# Patient Record
Sex: Female | Born: 1955 | Race: Black or African American | Hispanic: No | Smoking: Current every day smoker
Health system: Southern US, Community
[De-identification: ages and names within clinical notes are randomized; demographics above are authoritative.]

## PROBLEM LIST (undated history)

## (undated) DIAGNOSIS — E119 Type 2 diabetes mellitus without complications: Secondary | ICD-10-CM

## (undated) DIAGNOSIS — I219 Acute myocardial infarction, unspecified: Secondary | ICD-10-CM

## (undated) DIAGNOSIS — I10 Essential (primary) hypertension: Secondary | ICD-10-CM

---

## 2004-05-27 ENCOUNTER — Emergency Department: Payer: Self-pay | Admitting: Emergency Medicine

## 2004-12-16 ENCOUNTER — Emergency Department: Payer: Self-pay | Admitting: Unknown Physician Specialty

## 2005-02-25 ENCOUNTER — Emergency Department: Payer: Self-pay | Admitting: Emergency Medicine

## 2005-03-09 ENCOUNTER — Emergency Department: Payer: Self-pay | Admitting: Emergency Medicine

## 2005-04-11 ENCOUNTER — Emergency Department: Payer: Self-pay | Admitting: Unknown Physician Specialty

## 2005-08-05 ENCOUNTER — Emergency Department: Payer: Self-pay | Admitting: Emergency Medicine

## 2010-10-30 ENCOUNTER — Emergency Department: Payer: Self-pay | Admitting: Unknown Physician Specialty

## 2013-08-19 ENCOUNTER — Emergency Department: Payer: Self-pay | Admitting: Emergency Medicine

## 2015-12-12 ENCOUNTER — Encounter: Payer: Self-pay | Admitting: *Deleted

## 2015-12-12 ENCOUNTER — Emergency Department
Admission: EM | Admit: 2015-12-12 | Discharge: 2015-12-12 | Disposition: A | Discharge status: Home / Self Care | Payer: Medicare HMO | Attending: Emergency Medicine | Admitting: Emergency Medicine

## 2015-12-12 DIAGNOSIS — I252 Old myocardial infarction: Secondary | ICD-10-CM | POA: Diagnosis not present

## 2015-12-12 DIAGNOSIS — M5416 Radiculopathy, lumbar region: Secondary | ICD-10-CM | POA: Insufficient documentation

## 2015-12-12 DIAGNOSIS — F17200 Nicotine dependence, unspecified, uncomplicated: Secondary | ICD-10-CM | POA: Diagnosis not present

## 2015-12-12 DIAGNOSIS — I10 Essential (primary) hypertension: Secondary | ICD-10-CM | POA: Diagnosis not present

## 2015-12-12 DIAGNOSIS — M79604 Pain in right leg: Secondary | ICD-10-CM | POA: Diagnosis present

## 2015-12-12 HISTORY — DX: Essential (primary) hypertension: I10

## 2015-12-12 HISTORY — DX: Acute myocardial infarction, unspecified: I21.9

## 2015-12-12 MED ORDER — TRAMADOL HCL 50 MG PO TABS: 50.0000 mg | ORAL_TABLET | Freq: Two times a day (BID) | ORAL | Status: DC

## 2015-12-12 MED ORDER — CYCLOBENZAPRINE HCL 5 MG PO TABS: 5.0000 mg | ORAL_TABLET | Freq: Three times a day (TID) | ORAL | Status: DC | PRN

## 2015-12-12 NOTE — ED Notes (Signed)
Pt arrived to ED reporting right hip and leg pain beginning 4 days ago. Pt denies trauma or injury to leg. Pt reports pain began suddenly and the pain has been constant since. Pt denies anything decreasing the pain and movement increases the pain. Pt able to walk into triage room. No acute distress noted.

## 2015-12-12 NOTE — Discharge Instructions (Signed)
Lumbosacral Radiculopathy Lumbosacral radiculopathy is a condition that involves the spinal nerves and nerve roots in the low back and bottom of the spine. The condition develops when these nerves and nerve roots move out of place or become inflamed and cause symptoms. CAUSES This condition may be caused by:  Pressure from a disk that bulges out of place (herniated disk). A disk is a plate of cartilage that separates bones in the spine.  Disk degeneration.  A narrowing of the bones of the lower back (spinal stenosis).  A tumor.  An infection.  An injury that places sudden pressure on the disks that cushion the bones of your lower spine. RISK FACTORS This condition is more likely to develop in:  Males aged 30-50 years.  Females aged 71-60 years.  People who lift improperly.  People who are overweight or live a sedentary lifestyle.  People who smoke.  People who perform repetitive activities that strain the spine. SYMPTOMS Symptoms of this condition include:  Pain that goes down from the back into the legs (sciatica). This is the most common symptom. The pain may be worse with sitting, coughing, or sneezing.  Pain and numbness in the arms and legs.  Muscle weakness.  Tingling.  Loss of bladder control or bowel control. DIAGNOSIS This condition is diagnosed with a physical exam and medical history. If the pain is lasting, you may have tests, such as:  MRI scan.  X-ray.  CT scan.  Myelogram.  Nerve conduction study. TREATMENT This condition is often treated with:  Hot packs and ice applied to affected areas.  Stretches to improve flexibility.  Exercises to strengthen back muscles.  Physical therapy.  Pain medicine.  A steroid injection in the spine. In some cases, no treatment is needed. If the condition is long-lasting (chronic), or if symptoms are severe, treatment may involve surgery or lifestyle changes, such as following a weight loss plan. HOME  CARE INSTRUCTIONS Medicines  Take medicines only as directed by your health care provider.  Do not drive or operate heavy machinery while taking pain medicine. Injury Care  Apply a heat pack to the injured area as directed by your health care provider.  Apply ice to the affected area:  Put ice in a plastic bag.  Place a towel between your skin and the bag.  Leave the ice on for 20-30 minutes, every 2 hours while you are awake or as needed. Or, leave the ice on for as long as directed by your health care provider. Other Instructions  If you were shown how to do any exercises or stretches, do them as directed by your health care provider.  If your health care provider prescribed a diet or exercise program, follow it as directed.  Keep all follow-up visits as directed by your health care provider. This is important. SEEK MEDICAL CARE IF:  Your pain does not improve over time even when taking pain medicines. SEEK IMMEDIATE MEDICAL CARE IF:  Your develop severe pain.  Your pain suddenly gets worse.  You develop increasing weakness in your legs.  You lose the ability to control your bladder or bowel.  You have difficulty walking or balancing.  You have a fever.   This information is not intended to replace advice given to you by your health care provider. Make sure you discuss any questions you have with your health care provider.   Document Released: 08/01/2005 Document Revised: 12/16/2014 Document Reviewed: 07/28/2014 Elsevier Interactive Patient Education [employer].  Your symptoms appear to be consistent with a lumbar nerve irritation causing pain down your right leg. You should follow-up with your provider as planned next week. Take the prescription anti-spasm medicine, along with the pain medicine as needed. Return to the ED as needed.

## 2015-12-12 NOTE — ED Provider Notes (Signed)
[facility] Emergency Department Provider Note ____________________________________________  Time seen: 1313  I have reviewed the triage vital signs and the nursing notes.  HISTORY  Chief Complaint  Leg Pain   HPI Lindsay Lopez is a 60 y.o. female since the ED for evaluation of the pain to the right posterior buttocks that refers around to the anterior thigh just past the knee. She describes onset of the discomfort about 4 days prior to arrival. She denies any history of trauma, fall, or accident. She describes the pain as sharp in nature "like somebody cutting." She has a history of diabetic neuropathy for which she takes gabapentin. She has previously been evaluated for low back pain with her most recent x-rays reported about a year ago [facility]. She denies any particular diagnosis or treatment regimen following x-rays. She is however scheduled to see her primary care provider on Thursday of next week. She reports however that the pain had increased to the point where she could not wait until Thursday. She is only taking Tylenol in addition to her gabapentin and other home medications for diabetes and hypertension. She denies any hematuria, dysuria, flank pain, bladder or bowel incontinence, or leg weakness.Results for her discomfort at a 10/10 in triage.  Past Medical History  Diagnosis Date  . Hypertension   . MI (myocardial infarction) (HCC)     2010    There are no active problems to display for this patient.   No past surgical history on file.  Current Outpatient Rx  Name  Route  Sig  Dispense  Refill  . cyclobenzaprine (FLEXERIL) 5 MG tablet   Oral   Take 1 tablet (5 mg total) by mouth every 8 (eight) hours as needed for muscle spasms.   12 tablet   0   . traMADol (ULTRAM) 50 MG tablet   Oral   Take 1 tablet (50 mg total) by mouth 2 (two) times daily.   10 tablet   0     Allergies Review of patient's allergies indicates no  known allergies.  No family history on file.  Social History Social History  Substance Use Topics  . Smoking status: Current Every Day Smoker -- 0.50 packs/day  . Smokeless tobacco: Not on file  . Alcohol Use: No   Review of Systems  Constitutional: Negative for fever. Gastrointestinal: Negative for abdominal pain, vomiting and diarrhea. Genitourinary: Negative for dysuria. Musculoskeletal: Positive for back pain. Skin: Negative for rash. Neurological: Negative for headaches, focal weakness or numbness. ____________________________________________  PHYSICAL EXAM:  VITAL SIGNS: ED Triage Vitals  Enc Vitals Group     BP 12/12/15 1151 142/100 mmHg     Pulse Rate 12/12/15 1151 60     Resp 12/12/15 1151 16     Temp 12/12/15 1151 97.8 F (36.6 C)     Temp Source 12/12/15 1151 Oral     SpO2 12/12/15 1151 98 %     Weight 12/12/15 1151 202 lb (91.627 kg)     Height 12/12/15 1151 5\' 8"  (1.727 m)     Head Cir --      Peak Flow --      Pain Score 12/12/15 1152 10     Pain Loc --      Pain Edu? --      Excl. in GC? --    Constitutional: Alert and oriented. Well appearing and in no distress. Head: Normocephalic and atraumatic. Cardiovascular: Normal rate, regular rhythm.  Respiratory: Normal respiratory  effort. No wheezes/rales/rhonchi. Musculoskeletal: No spinal on it without midline tenderness, spasm, deformity, or step-off. Patient with fluid transition from sit to stand without difficulty. She is able to demonstrate a single leg straight leg raise without Trendelenburg. She is also noted to have full lumbar flexion range and extension range is somewhat self-limited. She is normal toe raise. On exam. Nontender with normal range of motion in all extremities.  Neurologic: Cranial nerves II through XII grossly intact. Normal LE DTRs bilaterally. Normal toe dorsiflexion on exam. Normal gait without ataxia. Normal speech and language. No gross focal neurologic deficits are  appreciated. Skin:  Skin is warm, dry and intact. No rash noted. ____________________________________________   RADIOLOGY Deferred. ____________________________________________  INITIAL IMPRESSION / ASSESSMENT AND PLAN / ED COURSE  She presents to the ED with a history of chronic low back pain with what appears to be a left radiculopathy. The symptoms appear to be distributed in the L3-4 nerve root. Patient will be referred to her primary care provider as scheduled on Thursday for more definitive evaluation management. Since there is no acute findings she will be discharged with a prescription for Flexeril and alternatives as directed. She is advised to return to the ED for acutely worsening symptoms including bladder or bowel incontinence or leg weakness. ____________________________________________  FINAL CLINICAL IMPRESSION(S) / ED DIAGNOSES  Final diagnoses:  Lumbar radiculopathy  Lumbar back pain with radiculopathy affecting right lower extremity      Lissa Hoard, PA-C 12/12/15 1351  Governor Rooks, MD 12/12/15 [phone]

## 2016-06-26 ENCOUNTER — Encounter: Payer: Self-pay | Admitting: Emergency Medicine

## 2016-06-26 ENCOUNTER — Emergency Department
Admission: EM | Admit: 2016-06-26 | Discharge: 2016-06-26 | Disposition: A | Discharge status: Home / Self Care | Payer: Medicare HMO | Attending: Emergency Medicine | Admitting: Emergency Medicine

## 2016-06-26 DIAGNOSIS — Z79899 Other long term (current) drug therapy: Secondary | ICD-10-CM | POA: Insufficient documentation

## 2016-06-26 DIAGNOSIS — I10 Essential (primary) hypertension: Secondary | ICD-10-CM | POA: Insufficient documentation

## 2016-06-26 DIAGNOSIS — H1032 Unspecified acute conjunctivitis, left eye: Secondary | ICD-10-CM | POA: Diagnosis not present

## 2016-06-26 DIAGNOSIS — F17200 Nicotine dependence, unspecified, uncomplicated: Secondary | ICD-10-CM | POA: Diagnosis not present

## 2016-06-26 DIAGNOSIS — H578 Other specified disorders of eye and adnexa: Secondary | ICD-10-CM | POA: Diagnosis present

## 2016-06-26 MED ORDER — POLYMYXIN B-TRIMETHOPRIM 10000-0.1 UNIT/ML-% OP SOLN: 2.0000 [drp] | Freq: Four times a day (QID) | OPHTHALMIC | 0 refills | Status: DC

## 2016-06-26 NOTE — ED Provider Notes (Signed)
[facility] Emergency Department Provider Note  ____________________________________________  Time seen: Approximately 5:03 PM  I have reviewed the triage vital signs and the nursing notes.   HISTORY  Chief Complaint Eye Problem    HPI Lindsay Lopez is a 60 y.o. female who presents emergency department complaining of left eye irritation, redness, purulent discharge. Patient states symptoms have been ongoing 2 days. Initially, patient thought symptoms were related to her allergies but she takes chronic allergy medication with no improvement. Patient states that today her eye began draining purulent green matter. She denies any visual changes. She denies any ocular pain. She denies any trauma to the eye. She does not wear glasses or contacts. No complaint of this time.   Past Medical History:  Diagnosis Date  . Hypertension   . MI (myocardial infarction)    2010    There are no active problems to display for this patient.   History reviewed. No pertinent surgical history.  Prior to Admission medications   Medication Sig Start Date End Date Taking? Authorizing Provider  cyclobenzaprine (FLEXERIL) 5 MG tablet Take 1 tablet (5 mg total) by mouth every 8 (eight) hours as needed for muscle spasms. 12/12/15   Jenise V Bacon Menshew, PA-C  traMADol (ULTRAM) 50 MG tablet Take 1 tablet (50 mg total) by mouth 2 (two) times daily. 12/12/15   Jenise V Bacon Menshew, PA-C  trimethoprim-polymyxin b (POLYTRIM) ophthalmic solution Place 2 drops into the left eye every 6 (six) hours. 06/26/16   Delorise RoyalsJonathan D Emberlie Gotcher, PA-C    Allergies Patient has no known allergies.  No family history on file.  Social History Social History  Substance Use Topics  . Smoking status: Current Every Day Smoker    Packs/day: 0.50  . Smokeless tobacco: Never Used  . Alcohol use No     Review of Systems  Constitutional: No fever/chills Eyes: No visual changes. Positive for purulent  discharge from left eye. Positive for left eye redness and irritation. ENT: No upper respiratory complaints. Cardiovascular: no chest pain. Respiratory: no cough. No SOB. Gastrointestinal: No abdominal pain.  No nausea, no vomiting.   Musculoskeletal: Negative for musculoskeletal pain. Skin: Negative for rash, abrasions, lacerations, ecchymosis. Neurological: Negative for headaches, focal weakness or numbness. 10-point ROS otherwise negative.  ____________________________________________   PHYSICAL EXAM:  VITAL SIGNS: ED Triage Vitals  Enc Vitals Group     BP 06/26/16 1654 (!) 168/62     Pulse Rate 06/26/16 1654 68     Resp 06/26/16 1654 18     Temp 06/26/16 1654 97.7 F (36.5 C)     Temp Source 06/26/16 1654 Oral     SpO2 06/26/16 1654 95 %     Weight 06/26/16 1653 210 lb (95.3 kg)     Height 06/26/16 1653 5\' 8"  (1.727 m)     Head Circumference --      Peak Flow --      Pain Score 06/26/16 1653 0     Pain Loc --      Pain Edu? --      Excl. in GC? --      Constitutional: Alert and oriented. Well appearing and in no acute distress. Eyes: Conjunctiva on the left is erythematous. Dried purulent drainage noted to lower eyelashes. Funduscopic exam is unremarkable bilaterally.Marland Kitchen. PERRL. EOMI. Head: Atraumatic. ENT:      Ears:       Nose: No congestion/rhinnorhea.      Mouth/Throat: Mucous membranes are moist.  Neck: No stridor.    Cardiovascular: Normal rate, regular rhythm. Normal S1 and S2.  Good peripheral circulation. Respiratory: Normal respiratory effort without tachypnea or retractions. Lungs CTAB. Good air entry to the bases with no decreased or absent breath sounds. Musculoskeletal: Full range of motion to all extremities. No gross deformities appreciated. Neurologic:  Normal speech and language. No gross focal neurologic deficits are appreciated.  Skin:  Skin is warm, dry and intact. No rash noted. Psychiatric: Mood and affect are normal. Speech and behavior are  normal. Patient exhibits appropriate insight and judgement.   ____________________________________________   LABS (all labs ordered are listed, but only abnormal results are displayed)  Labs Reviewed - No data to display ____________________________________________  EKG   ____________________________________________  RADIOLOGY   No results found.  ____________________________________________    PROCEDURES  Procedure(s) performed:    Procedures    Medications - No data to display   ____________________________________________   INITIAL IMPRESSION / ASSESSMENT AND PLAN / ED COURSE  Pertinent labs & imaging results that were available during my care of the patient were reviewed by me and considered in my medical decision making (see chart for details).  Review of the [place] CSRS was performed in accordance of the [facility] prior to dispensing any controlled drugs.  Clinical Course     Patient's diagnosis is consistent with Bacterial conjunctivitis of the left eye. Patient will be discharged home with prescriptions for antibiotic eyedrops. Patient is to follow up with ophthalmology as needed or otherwise directed. Patient is given ED precautions to return to the ED for any worsening or new symptoms.     ____________________________________________  FINAL CLINICAL IMPRESSION(S) / ED DIAGNOSES  Final diagnoses:  Acute bacterial conjunctivitis of left eye      NEW MEDICATIONS STARTED DURING THIS VISIT:  New Prescriptions   TRIMETHOPRIM-POLYMYXIN B (POLYTRIM) OPHTHALMIC SOLUTION    Place 2 drops into the left eye every 6 (six) hours.        This chart was dictated using voice recognition software/Dragon. Despite best efforts to proofread, errors can occur which can change the meaning. Any change was purely unintentional.    Racheal PatchesJonathan D Eldredge Veldhuizen, PA-C 06/26/16 1714    Emily FilbertJonathan E Williams, MD 06/26/16 [phone]

## 2016-06-26 NOTE — ED Triage Notes (Signed)
Patient presents to the ED with left eye pain, redness, and swelling since yesterday.  Patient denies eye trauma and denies blurry vision.  Patient is in no obvious distress at this time.

## 2016-06-26 NOTE — ED Notes (Signed)
NAD noted at time of D/C. Pt denies questions or concerns. Pt ambulatory to the lobby at this time.  

## 2017-03-24 ENCOUNTER — Emergency Department
Admission: EM | Admit: 2017-03-24 | Discharge: 2017-03-24 | Disposition: A | Discharge status: Home / Self Care | Payer: Medicare HMO | Attending: Emergency Medicine | Admitting: Emergency Medicine

## 2017-03-24 ENCOUNTER — Encounter: Payer: Self-pay | Admitting: Emergency Medicine

## 2017-03-24 DIAGNOSIS — F17200 Nicotine dependence, unspecified, uncomplicated: Secondary | ICD-10-CM | POA: Insufficient documentation

## 2017-03-24 DIAGNOSIS — Z79899 Other long term (current) drug therapy: Secondary | ICD-10-CM | POA: Diagnosis not present

## 2017-03-24 DIAGNOSIS — H5711 Ocular pain, right eye: Secondary | ICD-10-CM | POA: Diagnosis present

## 2017-03-24 DIAGNOSIS — I10 Essential (primary) hypertension: Secondary | ICD-10-CM | POA: Insufficient documentation

## 2017-03-24 DIAGNOSIS — H15001 Unspecified scleritis, right eye: Secondary | ICD-10-CM | POA: Insufficient documentation

## 2017-03-24 MED ORDER — PREDNISONE 10 MG (21) PO TBPK: ORAL_TABLET | ORAL | 0 refills | Status: DC

## 2017-03-24 NOTE — ED Triage Notes (Signed)
Left eye pain, drainage, redness x 1 day.

## 2017-03-24 NOTE — ED Provider Notes (Signed)
[facility] Emergency Department Provider Note  ____________________________________________  Time seen: Approximately 5:40 PM  I have reviewed the triage vital signs and the nursing notes.   HISTORY  Chief Complaint Eye Problem    HPI Lindsay Lopez is a 61 y.o. female presenting to the emergency department with scleral injection, increased tearing and pain with extraocular eye muscle movement of the left eye for one day. Patient denies crusting in the eyelashes or eyelid. She denies nausea and vomiting. She denies known contacts with bacterial conjunctivitis. Patient denies foreign body sensation or globe trauma. No alleviating measures have been attempted.   Past Medical History:  Diagnosis Date  . Hypertension   . MI (myocardial infarction) (HCC)    2010    There are no active problems to display for this patient.   History reviewed. No pertinent surgical history.  Prior to Admission medications   Medication Sig Start Date End Date Taking? Authorizing Provider  cyclobenzaprine (FLEXERIL) 5 MG tablet Take 1 tablet (5 mg total) by mouth every 8 (eight) hours as needed for muscle spasms. 12/12/15   Menshew, Charlesetta Ivory, PA-C  predniSONE (STERAPRED UNI-PAK 21 TAB) 10 MG (21) TBPK tablet Take 6 tabs the the 1st day. Take 6 tabs the the 2nd day. Take 5 tabs the the 3rd day. Take 5 tabs the 4th day. Take 4 tabs the the 5th day.Take 4 tabs the the 6th day.Take 3 tabs the 7th day.Take 3 tabs the 8th day. Take 2 tabs the 9th day. Take 2 tabs the 10th day. Take 1 tab the 11th day. Take 1 tab the 12th day. 03/24/17   Orvil Feil, PA-C  traMADol (ULTRAM) 50 MG tablet Take 1 tablet (50 mg total) by mouth 2 (two) times daily. 12/12/15   Menshew, Charlesetta Ivory, PA-C  trimethoprim-polymyxin b (POLYTRIM) ophthalmic solution Place 2 drops into the left eye every 6 (six) hours. 06/26/16   Cuthriell, Delorise Royals, PA-C    Allergies Patient has no known  allergies.  No family history on file.  Social History Social History  Substance Use Topics  . Smoking status: Current Every Day Smoker    Packs/day: 0.50  . Smokeless tobacco: Never Used  . Alcohol use No     Review of Systems  Constitutional: No fever/chills Eyes: Patient has increased tearing, scleral injection and pain with extraocular eye muscle movement, left. ENT: No upper respiratory complaints. Cardiovascular: no chest pain. Respiratory: no cough. No SOB. Gastrointestinal: No abdominal pain.  No nausea, no vomiting.  No diarrhea.  No constipation. Musculoskeletal: Negative for musculoskeletal pain. Skin: Negative for rash, abrasions, lacerations, ecchymosis. Neurological: Negative for headaches, focal weakness or numbness.   ____________________________________________   PHYSICAL EXAM:  VITAL SIGNS: ED Triage Vitals  Enc Vitals Group     BP 03/24/17 1615 (!) 149/84     Pulse Rate 03/24/17 1615 64     Resp 03/24/17 1615 16     Temp 03/24/17 1615 97.8 F (36.6 C)     Temp Source 03/24/17 1615 Oral     SpO2 03/24/17 1615 98 %     Weight 03/24/17 1614 202 lb (91.6 kg)     Height 03/24/17 1614 5\' 8"  (1.727 m)     Head Circumference --      Peak Flow --      Pain Score 03/24/17 1613 3     Pain Loc --      Pain Edu? --  Excl. in GC? --      Constitutional: Alert and oriented. Well appearing and in no acute distress. Eyes: Diffuse scleral injection of the left eye with mild chemosis. Pupils are equal round and reactive to light bilaterally. Patient has pain with extraocular eye muscle movement of the left eye. Head: Atraumatic. ENT:       Nose: No congestion/rhinnorhea.      Mouth/Throat: Mucous membranes are moist.  Neck: Full range of motion. Cardiovascular: Normal rate, regular rhythm. Normal S1 and S2.  Good peripheral circulation. Respiratory: Normal respiratory effort without tachypnea or retractions. Lungs CTAB. Good air entry to the bases with  no decreased or absent breath sounds. Skin:  Skin is warm, dry and intact. No rash noted. Psychiatric: Mood and affect are normal. Speech and behavior are normal. Patient exhibits appropriate insight and judgement.   ____________________________________________   LABS (all labs ordered are listed, but only abnormal results are displayed)  Labs Reviewed - No data to display ____________________________________________  EKG   ____________________________________________  RADIOLOGY   No results found.  ____________________________________________    PROCEDURES  Procedure(s) performed:    Procedures    Medications - No data to display   ____________________________________________   INITIAL IMPRESSION / ASSESSMENT AND PLAN / ED COURSE  Pertinent labs & imaging results that were available during my care of the patient were reviewed by me and considered in my medical decision making (see chart for details).  Review of the [place] CSRS was performed in accordance of the [facility] prior to dispensing any controlled drugs.    Assessment and plan Scleritis Patient presents to the emergency department with diffuse scleral injection, increased tearing and pain with extraocular eye muscle movement consistent with a diagnosis of scleritis. Patient was discharged with tapered prednisone and advised to seek care with ophthalmology this week. Patient voiced understanding regarding this recommendation. Vital signs were reassuring prior to discharge. All patient questions were answered.   ____________________________________________  FINAL CLINICAL IMPRESSION(S) / ED DIAGNOSES  Final diagnoses:  Scleritis of right eye      NEW MEDICATIONS STARTED DURING THIS VISIT:  New Prescriptions   PREDNISONE (STERAPRED UNI-PAK 21 TAB) 10 MG (21) TBPK TABLET    Take 6 tabs the the 1st day. Take 6 tabs the the 2nd day. Take 5 tabs the the 3rd day. Take 5 tabs the 4th day. Take 4 tabs the the  5th day.Take 4 tabs the the 6th day.Take 3 tabs the 7th day.Take 3 tabs the 8th day. Take 2 tabs the 9th day. Take 2 tabs the 10th day. Take 1 tab the 11th day. Take 1 tab the 12th day.        This chart was dictated using voice recognition software/Dragon. Despite best efforts to proofread, errors can occur which can change the meaning. Any change was purely unintentional.    Orvil FeilWoods, Shahid Flori M, PA-C 03/24/17 1801    Sharman CheekStafford, Phillip, MD 03/27/17 2328

## 2017-09-05 ENCOUNTER — Inpatient Hospital Stay
Admission: EM | Admit: 2017-09-05 | Discharge: 2017-09-06 | DRG: 683 | Disposition: A | Discharge status: Home / Self Care | Payer: Medicare Other | Attending: Internal Medicine | Admitting: Internal Medicine

## 2017-09-05 ENCOUNTER — Other Ambulatory Visit: Payer: Self-pay

## 2017-09-05 ENCOUNTER — Emergency Department: Payer: Medicare Other

## 2017-09-05 ENCOUNTER — Encounter: Payer: Self-pay | Admitting: Emergency Medicine

## 2017-09-05 DIAGNOSIS — Z794 Long term (current) use of insulin: Secondary | ICD-10-CM

## 2017-09-05 DIAGNOSIS — E785 Hyperlipidemia, unspecified: Secondary | ICD-10-CM | POA: Diagnosis present

## 2017-09-05 DIAGNOSIS — I2510 Atherosclerotic heart disease of native coronary artery without angina pectoris: Secondary | ICD-10-CM | POA: Diagnosis present

## 2017-09-05 DIAGNOSIS — I10 Essential (primary) hypertension: Secondary | ICD-10-CM | POA: Diagnosis not present

## 2017-09-05 DIAGNOSIS — F17200 Nicotine dependence, unspecified, uncomplicated: Secondary | ICD-10-CM | POA: Diagnosis present

## 2017-09-05 DIAGNOSIS — E119 Type 2 diabetes mellitus without complications: Secondary | ICD-10-CM | POA: Diagnosis present

## 2017-09-05 DIAGNOSIS — I252 Old myocardial infarction: Secondary | ICD-10-CM

## 2017-09-05 DIAGNOSIS — E860 Dehydration: Secondary | ICD-10-CM | POA: Diagnosis present

## 2017-09-05 DIAGNOSIS — R531 Weakness: Secondary | ICD-10-CM

## 2017-09-05 DIAGNOSIS — N3001 Acute cystitis with hematuria: Secondary | ICD-10-CM | POA: Diagnosis not present

## 2017-09-05 DIAGNOSIS — L732 Hidradenitis suppurativa: Secondary | ICD-10-CM | POA: Diagnosis not present

## 2017-09-05 DIAGNOSIS — Z79899 Other long term (current) drug therapy: Secondary | ICD-10-CM | POA: Diagnosis not present

## 2017-09-05 DIAGNOSIS — Z7982 Long term (current) use of aspirin: Secondary | ICD-10-CM

## 2017-09-05 DIAGNOSIS — Z888 Allergy status to other drugs, medicaments and biological substances status: Secondary | ICD-10-CM | POA: Diagnosis not present

## 2017-09-05 DIAGNOSIS — Z955 Presence of coronary angioplasty implant and graft: Secondary | ICD-10-CM

## 2017-09-05 DIAGNOSIS — N179 Acute kidney failure, unspecified: Principal | ICD-10-CM

## 2017-09-05 DIAGNOSIS — Z9181 History of falling: Secondary | ICD-10-CM

## 2017-09-05 LAB — BASIC METABOLIC PANEL
Anion gap: 12 (ref 5–15)
BUN: 20 mg/dL (ref 6–20)
CALCIUM: 9.6 mg/dL (ref 8.9–10.3)
CO2: 22 mmol/L (ref 22–32)
CREATININE: 1.44 mg/dL — AB (ref 0.44–1.00)
Chloride: 102 mmol/L (ref 101–111)
GFR calc non Af Amer: 38 mL/min — ABNORMAL LOW (ref >60)
GFR, EST AFRICAN AMERICAN: 44 mL/min — AB (ref >60)
Glucose, Bld: 87 mg/dL (ref 65–99)
Potassium: 3.7 mmol/L (ref 3.5–5.1)
SODIUM: 136 mmol/L (ref 135–145)

## 2017-09-05 LAB — URINALYSIS, COMPLETE (UACMP) WITH MICROSCOPIC
BILIRUBIN URINE: NEGATIVE
Glucose, UA: NEGATIVE mg/dL
KETONES UR: NEGATIVE mg/dL
Leukocytes, UA: NEGATIVE
Nitrite: NEGATIVE
Protein, ur: 100 mg/dL — AB
SPECIFIC GRAVITY, URINE: 1.026 (ref 1.005–1.030)
pH: 5 (ref 5.0–8.0)

## 2017-09-05 LAB — CBC
HCT: 45.3 % (ref 35.0–47.0)
Hemoglobin: 15.3 g/dL (ref 12.0–16.0)
MCH: 28.8 pg (ref 26.0–34.0)
MCHC: 33.8 g/dL (ref 32.0–36.0)
MCV: 85.2 fL (ref 80.0–100.0)
Platelets: 254 10*3/uL (ref 150–440)
RBC: 5.32 MIL/uL — AB (ref 3.80–5.20)
RDW: 13.4 % (ref 11.5–14.5)
WBC: 18.8 10*3/uL — ABNORMAL HIGH (ref 3.6–11.0)

## 2017-09-05 LAB — TROPONIN I: Troponin I: <0.03 ng/mL (ref <0.03)

## 2017-09-05 LAB — GLUCOSE, CAPILLARY: Glucose-Capillary: 99 mg/dL (ref 65–99)

## 2017-09-05 MED ORDER — METFORMIN HCL 500 MG PO TABS
500.0000 mg | ORAL_TABLET | ORAL | Status: DC
  Administered 2017-09-06: 500 mg via ORAL
  Filled 2017-09-05: qty 1

## 2017-09-05 MED ORDER — ONDANSETRON HCL 4 MG/2ML IJ SOLN: 4.0000 mg | Freq: Four times a day (QID) | INTRAMUSCULAR | Status: DC | PRN

## 2017-09-05 MED ORDER — ENALAPRIL MALEATE 10 MG PO TABS
20.0000 mg | ORAL_TABLET | Freq: Two times a day (BID) | ORAL | Status: DC
  Filled 2017-09-05 (×2): qty 2

## 2017-09-05 MED ORDER — PRAVASTATIN SODIUM 20 MG PO TABS
80.0000 mg | ORAL_TABLET | Freq: Every day | ORAL | Status: DC
Start: 2017-09-06 — End: 2017-09-06
  Administered 2017-09-06: 80 mg via ORAL
  Filled 2017-09-05: qty 4

## 2017-09-05 MED ORDER — ONDANSETRON HCL 4 MG PO TABS: 4.0000 mg | ORAL_TABLET | Freq: Four times a day (QID) | ORAL | Status: DC | PRN

## 2017-09-05 MED ORDER — HYDROCHLOROTHIAZIDE 12.5 MG PO CAPS: 12.5000 mg | ORAL_CAPSULE | Freq: Every day | ORAL | Status: DC

## 2017-09-05 MED ORDER — CEFTRIAXONE SODIUM IN DEXTROSE 20 MG/ML IV SOLN
1.0000 g | Freq: Once | INTRAVENOUS | Status: AC
  Administered 2017-09-05: 1 g via INTRAVENOUS
  Filled 2017-09-05: qty 50

## 2017-09-05 MED ORDER — SODIUM CHLORIDE 0.9 % IV SOLN
Freq: Once | INTRAVENOUS | Status: AC
  Administered 2017-09-05: via INTRAVENOUS

## 2017-09-05 MED ORDER — PAROXETINE HCL 20 MG PO TABS
20.0000 mg | ORAL_TABLET | Freq: Every day | ORAL | Status: DC
  Administered 2017-09-06: 20 mg via ORAL
  Filled 2017-09-05: qty 1

## 2017-09-05 MED ORDER — INSULIN ASPART 100 UNIT/ML ~~LOC~~ SOLN
0.0000 [IU] | Freq: Three times a day (TID) | SUBCUTANEOUS | Status: DC
  Administered 2017-09-06: 3 [IU] via SUBCUTANEOUS
  Administered 2017-09-06: 2 [IU] via SUBCUTANEOUS
  Administered 2017-09-06: 7 [IU] via SUBCUTANEOUS
  Filled 2017-09-05 (×3): qty 1

## 2017-09-05 MED ORDER — DOCUSATE SODIUM 100 MG PO CAPS
100.0000 mg | ORAL_CAPSULE | Freq: Two times a day (BID) | ORAL | Status: DC
  Administered 2017-09-06: 100 mg via ORAL
  Filled 2017-09-05: qty 1

## 2017-09-05 MED ORDER — DEXTROSE 5 % IV SOLN
1.0000 g | INTRAVENOUS | Status: DC
  Filled 2017-09-05: qty 10

## 2017-09-05 MED ORDER — GLIMEPIRIDE 4 MG PO TABS
4.0000 mg | ORAL_TABLET | Freq: Two times a day (BID) | ORAL | Status: DC
  Filled 2017-09-05 (×2): qty 1

## 2017-09-05 MED ORDER — ACETAMINOPHEN 325 MG PO TABS
650.0000 mg | ORAL_TABLET | Freq: Four times a day (QID) | ORAL | Status: DC | PRN
  Administered 2017-09-06: 650 mg via ORAL
  Filled 2017-09-05: qty 2

## 2017-09-05 MED ORDER — INSULIN GLARGINE 100 UNIT/ML ~~LOC~~ SOLN
50.0000 [IU] | Freq: Every day | SUBCUTANEOUS | Status: DC
  Filled 2017-09-05: qty 0.5

## 2017-09-05 MED ORDER — LORATADINE 10 MG PO TABS
10.0000 mg | ORAL_TABLET | Freq: Every day | ORAL | Status: DC
  Administered 2017-09-06: 10 mg via ORAL
  Filled 2017-09-05: qty 1

## 2017-09-05 MED ORDER — TRAZODONE HCL 50 MG PO TABS: 25.0000 mg | ORAL_TABLET | Freq: Every evening | ORAL | Status: DC | PRN

## 2017-09-05 MED ORDER — LEVOCETIRIZINE DIHYDROCHLORIDE 5 MG PO TABS: 5.0000 mg | ORAL_TABLET | Freq: Every evening | ORAL | Status: DC

## 2017-09-05 MED ORDER — DULAGLUTIDE 0.75 MG/0.5ML ~~LOC~~ SOAJ: SUBCUTANEOUS | Status: DC

## 2017-09-05 MED ORDER — HEPARIN SODIUM (PORCINE) 5000 UNIT/ML IJ SOLN
5000.0000 [IU] | Freq: Three times a day (TID) | INTRAMUSCULAR | Status: DC
  Administered 2017-09-06 (×2): 5000 [IU] via SUBCUTANEOUS
  Filled 2017-09-05 (×2): qty 1

## 2017-09-05 MED ORDER — HYDROCODONE-ACETAMINOPHEN 5-325 MG PO TABS: 1.0000 | ORAL_TABLET | ORAL | Status: DC | PRN

## 2017-09-05 MED ORDER — LEVETIRACETAM 750 MG PO TABS
750.0000 mg | ORAL_TABLET | Freq: Two times a day (BID) | ORAL | Status: DC
  Administered 2017-09-06: 750 mg via ORAL
  Filled 2017-09-05 (×3): qty 1

## 2017-09-05 MED ORDER — SODIUM CHLORIDE 0.9 % IV BOLUS (SEPSIS)
1000.0000 mL | Freq: Once | INTRAVENOUS | Status: AC
  Administered 2017-09-05: 1000 mL via INTRAVENOUS

## 2017-09-05 MED ORDER — GABAPENTIN 300 MG PO CAPS
300.0000 mg | ORAL_CAPSULE | Freq: Three times a day (TID) | ORAL | Status: DC
  Administered 2017-09-06 (×2): 300 mg via ORAL
  Filled 2017-09-05 (×3): qty 1

## 2017-09-05 MED ORDER — INSULIN ASPART 100 UNIT/ML ~~LOC~~ SOLN: 0.0000 [IU] | Freq: Every day | SUBCUTANEOUS | Status: DC

## 2017-09-05 MED ORDER — BISACODYL 5 MG PO TBEC: 5.0000 mg | DELAYED_RELEASE_TABLET | Freq: Every day | ORAL | Status: DC | PRN

## 2017-09-05 MED ORDER — ASPIRIN EC 81 MG PO TBEC
81.0000 mg | DELAYED_RELEASE_TABLET | Freq: Every day | ORAL | Status: DC
  Administered 2017-09-06: 81 mg via ORAL
  Filled 2017-09-05: qty 1

## 2017-09-05 MED ORDER — ACETAMINOPHEN 650 MG RE SUPP: 650.0000 mg | Freq: Four times a day (QID) | RECTAL | Status: DC | PRN

## 2017-09-05 MED ORDER — METOPROLOL TARTRATE 50 MG PO TABS
100.0000 mg | ORAL_TABLET | Freq: Two times a day (BID) | ORAL | Status: DC
  Filled 2017-09-05 (×2): qty 2

## 2017-09-05 NOTE — ED Provider Notes (Signed)
[facility] Emergency Department Provider Note  ____________________________________________  Time seen: Approximately 9:47 PM  I have reviewed the triage vital signs and the nursing notes.   HISTORY  Chief Complaint Fall and Dizziness   HPI Lindsay Lopez is a 62 y.o. female the history of CAD status post MI and stents in 2010, diabetes, seizure, hypertension, hyperlipidemia who presents for evaluation of generalized weakness. Patient reports progressively worsening generalized weakness for the last week. She has had decreased appetite. Today she had 3 falls. She describes all 3 falls happened while she was sitting on the side of the bed and felt that her legs either gave out or slipped and she fell onto her buttock on the ground. No injuries sustained. No head trauma, no LOC, she is not on blood thinners. She denies abdominal pain, chest pain, nausea, vomiting, diarrhea, melena, dysuria, hematuria. She does endorse a cough for the last week. No body aches or fever.   Chief Complaint: Generalized weakness Quality: constant Severity: moderate to severe Duration: 1 week Context: decreased PO intake Associated signs/symptoms: several falls    Past Medical History:  Diagnosis Date  . Hypertension   . MI (myocardial infarction) (HCC)    2010    There are no active problems to display for this patient.   History reviewed. No pertinent surgical history.  Prior to Admission medications   Medication Sig Start Date End Date Taking? Authorizing Provider  cyclobenzaprine (FLEXERIL) 5 MG tablet Take 1 tablet (5 mg total) by mouth every 8 (eight) hours as needed for muscle spasms. 12/12/15   Menshew, Charlesetta IvoryJenise V Bacon, PA-C  predniSONE (STERAPRED UNI-PAK 21 TAB) 10 MG (21) TBPK tablet Take 6 tabs the the 1st day. Take 6 tabs the the 2nd day. Take 5 tabs the the 3rd day. Take 5 tabs the 4th day. Take 4 tabs the the 5th day.Take 4 tabs the the 6th day.Take 3 tabs  the 7th day.Take 3 tabs the 8th day. Take 2 tabs the 9th day. Take 2 tabs the 10th day. Take 1 tab the 11th day. Take 1 tab the 12th day. 03/24/17   Orvil FeilWoods, Jaclyn M, PA-C  traMADol (ULTRAM) 50 MG tablet Take 1 tablet (50 mg total) by mouth 2 (two) times daily. 12/12/15   Menshew, Charlesetta IvoryJenise V Bacon, PA-C  trimethoprim-polymyxin b (POLYTRIM) ophthalmic solution Place 2 drops into the left eye every 6 (six) hours. 06/26/16   Cuthriell, Delorise RoyalsJonathan D, PA-C    Allergies Patient has no known allergies.  No family history on file.  Social History Social History   Tobacco Use  . Smoking status: Current Every Day Smoker    Packs/day: 0.50  . Smokeless tobacco: Never Used  Substance Use Topics  . Alcohol use: No  . Drug use: No    Review of Systems  Constitutional: Negative for fever. + Generalized weakness  Eyes: Negative for visual changes. ENT: Negative for sore throat. Neck: No neck pain  Cardiovascular: Negative for chest pain. Respiratory: Negative for shortness of breath. Gastrointestinal: Negative for abdominal pain, vomiting or diarrhea. + Decreased appetite  Genitourinary: Negative for dysuria. Musculoskeletal: Negative for back pain. Skin: Negative for rash. Neurological: Negative for headaches, weakness or numbness. Psych: No SI or HI  ____________________________________________   PHYSICAL EXAM:  VITAL SIGNS: ED Triage Vitals  Enc Vitals Group     BP 09/05/17 1810 106/64     Pulse Rate 09/05/17 1810 95     Resp 09/05/17 1810 16  Temp 09/05/17 1810 99.4 F (37.4 C)     Temp Source 09/05/17 1810 Oral     SpO2 09/05/17 1810 94 %     Weight 09/05/17 1811 220 lb (99.8 kg)     Height 09/05/17 1811 5\' 8"  (1.727 m)     Head Circumference --      Peak Flow --      Pain Score 09/05/17 1811 5     Pain Loc --      Pain Edu? --      Excl. in GC? --     Constitutional: Alert and oriented. Well appearing and in no apparent distress. HEENT:      Head: Normocephalic and  atraumatic.         Eyes: Conjunctivae are normal. Sclera is non-icteric.       Mouth/Throat: Mucous membranes are dry.       Neck: Supple with no signs of meningismus. Cardiovascular: Regular rate and rhythm. No murmurs, gallops, or rubs. 2+ symmetrical distal pulses are present in all extremities. No JVD. Respiratory: Normal respiratory effort. Lungs are clear to auscultation bilaterally. No wheezes, crackles, or rhonchi.  Gastrointestinal: Soft, non tender, and non distended with positive bowel sounds. No rebound or guarding. Genitourinary: No CVA tenderness. Musculoskeletal: Nontender with normal range of motion in all extremities. No edema, cyanosis, or erythema of extremities. Neurologic: Normal speech and language. Face is symmetric. Moving all extremities. No gross focal neurologic deficits are appreciated. Skin: Skin is warm, dry and intact. No rash noted. Psychiatric: Mood and affect are normal. Speech and behavior are normal.  ____________________________________________   LABS (all labs ordered are listed, but only abnormal results are displayed)  Labs Reviewed  BASIC METABOLIC PANEL - Abnormal; Notable for the following components:      Result Value   Creatinine, Ser 1.44 (*)    GFR calc non Af Amer 38 (*)    GFR calc Af Amer 44 (*)    All other components within normal limits  CBC - Abnormal; Notable for the following components:   WBC 18.8 (*)    RBC 5.32 (*)    All other components within normal limits  URINALYSIS, COMPLETE (UACMP) WITH MICROSCOPIC - Abnormal; Notable for the following components:   Color, Urine AMBER (*)    APPearance CLOUDY (*)    Hgb urine dipstick MODERATE (*)    Protein, ur 100 (*)    Bacteria, UA RARE (*)    Squamous Epithelial / LPF 6-30 (*)    Non Squamous Epithelial 0-5 (*)    All other components within normal limits  URINE CULTURE  TROPONIN I  CBG MONITORING, ED   ____________________________________________  EKG  ED ECG  REPORT I, Nita Sickle, the attending physician, personally viewed and interpreted this ECG.  18:19 - Normal sinus rhythm, rate of 95, normal intervals, normal axis, slight ST depression in 2 with T-wave inversion in 1 and aVR, no ST elevation. No prior for comparison   21:41 - normal sinus rhythm, rate of 95, normal intervals, normal axis, persistent ST depression in lead 2 with T-wave inversion in 1 and aVL, no ST elevation. Unchanged from initial.  ____________________________________________  RADIOLOGY  CXR: negative  ____________________________________________   PROCEDURES  Procedure(s) performed: None Procedures Critical Care performed:  None ____________________________________________   INITIAL IMPRESSION / ASSESSMENT AND PLAN / ED COURSE   62 y.o. female the history of CAD status post MI and stents in 2010, diabetes, seizure, hypertension, hyperlipidemia who presents for  evaluation of generalized weakness and decreased appetite times one week. Patient looks dry on exam, heart rate slightly elevated at 95 with BP of 106/64, she has a low-grade temp of 99.72F. abdomen is soft, lungs are clear to auscultation. Labs consistent with dehydration with acute kidney injury and a creatinine of 1.44, white count of 18.8 and hematocrit of 45. UA showing rare bacteria but no nitrites. She has no UTI symptoms however this could be the source of possible infection. We'll send her for chest x-ray since she is coughing to rule out pneumonia. We'll give IV fluids. We'll check orthostatic vital signs. Anticipate admission.      As part of my medical decision making, I reviewed the following data within the electronic MEDICAL RECORD NUMBER History obtained from family, Nursing notes reviewed and incorporated, Labs reviewed , EKG interpreted , Radiograph reviewed , Discussed with admitting physician , Notes from prior ED visits and [place] Controlled Substance Database    Pertinent labs & imaging  results that were available during my care of the patient were reviewed by me and considered in my medical decision making (see chart for details).    ____________________________________________   FINAL CLINICAL IMPRESSION(S) / ED DIAGNOSES  Final diagnoses:  AKI (acute kidney injury) (HCC)  Generalized weakness  Acute cystitis with hematuria      NEW MEDICATIONS STARTED DURING THIS VISIT:  ED Discharge Orders    None       Note:  This document was prepared using Dragon voice recognition software and may include unintentional dictation errors.    Nita Sickle, MD 09/05/17 2223

## 2017-09-05 NOTE — ED Notes (Signed)
Sister Kathyrn SheriffLisa Thompson [phone]

## 2017-09-05 NOTE — ED Triage Notes (Signed)
Pt in via [facility] from home with complaints of 2 falls today, pt reports dizziness since last night in which she believes is what has caused her to fall.  Pt A/Ox4, vitals WDL, NAD noted at this time.

## 2017-09-05 NOTE — ED Notes (Signed)
Admitting Provider at bedside. 

## 2017-09-05 NOTE — ED Notes (Signed)
Patient transported to X-ray 

## 2017-09-05 NOTE — ED Notes (Signed)
Pt states falls yesterday and today. States she does NOT use cane/walker. States she sometimes gets dizzy prior to fall. Pt appears fatigued. No distress noted. Pt states she has hit L and R arm and legs. Denies hitting head. Moving all extremities on own.

## 2017-09-06 DIAGNOSIS — N179 Acute kidney failure, unspecified: Secondary | ICD-10-CM | POA: Diagnosis not present

## 2017-09-06 LAB — CBC
HEMATOCRIT: 40.2 % (ref 35.0–47.0)
HEMOGLOBIN: 13.6 g/dL (ref 12.0–16.0)
MCH: 28.7 pg (ref 26.0–34.0)
MCHC: 33.8 g/dL (ref 32.0–36.0)
MCV: 85.1 fL (ref 80.0–100.0)
Platelets: 189 10*3/uL (ref 150–440)
RBC: 4.72 MIL/uL (ref 3.80–5.20)
RDW: 13.4 % (ref 11.5–14.5)
WBC: 20.9 10*3/uL — AB (ref 3.6–11.0)

## 2017-09-06 LAB — BASIC METABOLIC PANEL
ANION GAP: 13 (ref 5–15)
BUN: 25 mg/dL — AB (ref 6–20)
CHLORIDE: 101 mmol/L (ref 101–111)
CO2: 21 mmol/L — ABNORMAL LOW (ref 22–32)
Calcium: 8.7 mg/dL — ABNORMAL LOW (ref 8.9–10.3)
Creatinine, Ser: 1.42 mg/dL — ABNORMAL HIGH (ref 0.44–1.00)
GFR calc Af Amer: 45 mL/min — ABNORMAL LOW (ref >60)
GFR calc non Af Amer: 39 mL/min — ABNORMAL LOW (ref >60)
Glucose, Bld: 141 mg/dL — ABNORMAL HIGH (ref 65–99)
POTASSIUM: 3.6 mmol/L (ref 3.5–5.1)
SODIUM: 135 mmol/L (ref 135–145)

## 2017-09-06 LAB — GLUCOSE, CAPILLARY
GLUCOSE-CAPILLARY: 248 mg/dL — AB (ref 65–99)
Glucose-Capillary: 163 mg/dL — ABNORMAL HIGH (ref 65–99)
Glucose-Capillary: 333 mg/dL — ABNORMAL HIGH (ref 65–99)

## 2017-09-06 LAB — CREATININE, SERUM
CREATININE: 1.23 mg/dL — AB (ref 0.44–1.00)
GFR calc Af Amer: 54 mL/min — ABNORMAL LOW (ref >60)
GFR, EST NON AFRICAN AMERICAN: 46 mL/min — AB (ref >60)

## 2017-09-06 MED ORDER — SODIUM CHLORIDE 0.9 % IV SOLN: INTRAVENOUS | Status: DC

## 2017-09-06 MED ORDER — ENALAPRIL MALEATE 20 MG PO TABS: 20.0000 mg | ORAL_TABLET | Freq: Every day | ORAL | 0 refills | Status: AC

## 2017-09-06 MED ORDER — INSULIN GLARGINE 100 UNIT/ML ~~LOC~~ SOLN
30.0000 [IU] | Freq: Every day | SUBCUTANEOUS | Status: DC
  Filled 2017-09-06: qty 0.3

## 2017-09-06 MED ORDER — HYDROCODONE-ACETAMINOPHEN 5-325 MG PO TABS: 1.0000 | ORAL_TABLET | Freq: Four times a day (QID) | ORAL | 0 refills | Status: DC | PRN

## 2017-09-06 MED ORDER — HYDROCHLOROTHIAZIDE 12.5 MG PO CAPS: 12.5000 mg | ORAL_CAPSULE | Freq: Every day | ORAL | 0 refills | Status: AC

## 2017-09-06 MED ORDER — CLINDAMYCIN HCL 300 MG PO CAPS: 600.0000 mg | ORAL_CAPSULE | Freq: Three times a day (TID) | ORAL | 0 refills | Status: DC

## 2017-09-06 MED ORDER — CLINDAMYCIN HCL 150 MG PO CAPS
600.0000 mg | ORAL_CAPSULE | Freq: Three times a day (TID) | ORAL | Status: DC
  Administered 2017-09-06: 600 mg via ORAL
  Filled 2017-09-06 (×2): qty 4

## 2017-09-06 MED ORDER — SODIUM CHLORIDE 0.9 % IV SOLN
INTRAVENOUS | Status: DC
  Administered 2017-09-06: 12:00:00 via INTRAVENOUS

## 2017-09-06 NOTE — Discharge Summary (Signed)
[facility] [place] [facility]   PATIENT NAME: Lindsay Lopez    MR#:  [id number]  DATE OF BIRTH:  June 16, 1956  DATE OF ADMISSION:  09/05/2017 ADMITTING PHYSICIAN: Cammy Copa, MD  DATE OF DISCHARGE: 09/06/2017  PRIMARY CARE PHYSICIAN: Kimel-Scott, Clydie Braun, MD    ADMISSION DIAGNOSIS:  Acute cystitis with hematuria [N30.01] Generalized weakness [R53.1] AKI (acute kidney injury) (HCC) [N17.9]  DISCHARGE DIAGNOSIS:  Acute renal failure secondary to dehydration from poor p.o. intake and medication Hidradenitis of left vulva  SECONDARY DIAGNOSIS:   Past Medical History:  Diagnosis Date  . Hypertension   . MI (myocardial infarction) [facility])    2010    HOSPITAL COURSE:   Lindsay Lopez  is a 62 y.o. female with a known history of coronary artery disease status post MI and stents in 2010, diabetes type 2, hypertension and hyperlipidemia. Patient was brought to emergency room for generalized weakness going on for the past few weeks, gradually getting worse.  She also had 3 mechanical falls today while she was sitting at the side of the bed.  1.  Acute renal failure, likely prerenal, secondary to poor hydration - patient appears clinically dehydrated.  She received adequate IV fluids.  Her blood pressure improved.   -Lisinopril metoprolol hydrochlorothiazide were held which will be resumed with adjusted doses on discharge.   -We will check creatinine prior to discharge.  Patient will follow up with primary care physician next week to get her metabolic panel checked.    2.    Hidradenitis of left vulva -Clindamycin 600 mg 3 times a day for 7 days -White count on admission was elevated  3.  Coronary artery disease, stable, continue medical management. 4.  Diabetes type 2, stable.  Blood sugar in the emergency room was only 99.    Continue to monitor blood sugars before meals and at bedtime.  5.  Generalized weakness and deconditioning, worsening secondary  to acute illness; will have PT evaluation noted.  No follow-up needed.  Rolling walker prescribed  Patient is requesting to go home.  She is feeling a whole lot better.  She is recommended to keep herself hydrated finished the course with clindamycin follow-up with primary care physician as outpatient.      CONSULTS OBTAINED:    DRUG ALLERGIES:   Allergies  Allergen Reactions  . Propoxyphene Other (See Comments)    Sees little things    DISCHARGE MEDICATIONS:   Allergies as of 09/06/2017      Reactions   Propoxyphene Other (See Comments)   Sees little things      Medication List    TAKE these medications   acetaminophen 500 MG tablet Commonly known as:  TYLENOL Take 500 mg by mouth every 6 (six) hours as needed.   aspirin EC 81 MG tablet Take 81 mg by mouth daily.   clindamycin 300 MG capsule Commonly known as:  CLEOCIN Take 2 capsules (600 mg total) by mouth every 8 (eight) hours.   enalapril 20 MG tablet Commonly known as:  VASOTEC Take 1 tablet (20 mg total) by mouth daily. What changed:  when to take this   gabapentin 300 MG capsule Commonly known as:  NEURONTIN Take 300 mg by mouth 3 (three) times daily.   glimepiride 4 MG tablet Commonly known as:  AMARYL Take 4 mg by mouth 2 (two) times daily.   hydrochlorothiazide 12.5 MG capsule Commonly known as:  MICROZIDE Take 1 capsule (12.5 mg total) by mouth  daily. Start taking on:  09/11/2017 What changed:  These instructions start on 09/11/2017. If you are unsure what to do until then, ask your doctor or other care provider.   HYDROcodone-acetaminophen 5-325 MG tablet Commonly known as:  NORCO/VICODIN Take 1 tablet by mouth every 6 (six) hours as needed for moderate pain.   insulin aspart 100 UNIT/ML injection Commonly known as:  novoLOG Inject 24 Units into the skin 2 (two) times daily.   insulin glargine 100 UNIT/ML injection Commonly known as:  LANTUS Inject 50 Units into the skin at bedtime.    levETIRAcetam 750 MG tablet Commonly known as:  KEPPRA Take 750 mg by mouth 2 (two) times daily.   levocetirizine 5 MG tablet Commonly known as:  XYZAL Take 5 mg by mouth every evening.   metFORMIN 500 MG tablet Commonly known as:  GLUCOPHAGE Take 500 mg by mouth every other day.   metoprolol tartrate 100 MG tablet Commonly known as:  LOPRESSOR Take 100 mg by mouth 2 (two) times daily.   PARoxetine 20 MG tablet Commonly known as:  PAXIL Take 20 mg by mouth daily.   pravastatin 80 MG tablet Commonly known as:  PRAVACHOL Take 80 mg by mouth daily.   TRULICITY Mineral Inject 1 Dose into the skin once a week.            Durable Medical Equipment  (From admission, onward)        Start     Ordered   09/06/17 1339  For home use only DME Walker rolling  Once    Question:  Patient needs a walker to treat with the following condition  Answer:  Weakness   09/06/17 1338      If you experience worsening of your admission symptoms, develop shortness of breath, life threatening emergency, suicidal or homicidal thoughts you must seek medical attention immediately by calling 911 or calling your MD immediately  if symptoms less severe.  You Must read complete instructions/literature along with all the possible adverse reactions/side effects for all the Medicines you take and that have been prescribed to you. Take any new Medicines after you have completely understood and accept all the possible adverse reactions/side effects.   Please note  You were cared for by a hospitalist during your hospital stay. If you have any questions about your discharge medications or the care you received while you were in the hospital after you are discharged, you can call the unit and asked to speak with the hospitalist on call if the hospitalist that took care of you is not available. Once you are discharged, your primary care physician will handle any further medical issues. Please note that NO REFILLS  for any discharge medications will be authorized once you are discharged, as it is imperative that you return to your primary care physician (or establish a relationship with a primary care physician if you do not have one) for your aftercare needs so that they can reassess your need for medications and monitor your lab values. Today   SUBJECTIVE   I feel better than yesterday  VITAL SIGNS:  Blood pressure (!) 126/52, pulse 97, temperature (!) 97.5 F (36.4 C), temperature source Oral, resp. rate 20, height 5\' 8"  (1.727 m), weight 103.2 kg (227 lb 9.6 oz), SpO2 99 %.  I/O:    Intake/Output Summary (Last 24 hours) at 09/06/2017 1345 Last data filed at 09/06/2017 1210 Gross per 24 hour  Intake 1103.7 ml  Output 0 ml  Net  1103.7 ml    PHYSICAL EXAMINATION:  GENERAL:  62 y.o.-year-old patient lying in the bed with no acute distress.  EYES: Pupils equal, round, reactive to light and accommodation. No scleral icterus. Extraocular muscles intact.  HEENT: Head atraumatic, normocephalic. Oropharynx and nasopharynx clear.  NECK:  Supple, no jugular venous distention. No thyroid enlargement, no tenderness.  LUNGS: Normal breath sounds bilaterally, no wheezing, rales,rhonchi or crepitation. No use of accessory muscles of respiration.  CARDIOVASCULAR: S1, S2 normal. No murmurs, rubs, or gallops.  ABDOMEN: Soft, non-tender, non-distended. Bowel sounds present. No organomegaly or mass.  Tenderness on the left vulva--no pus discharge, some induration of the left vulva EXTREMITIES: No pedal edema, cyanosis, or clubbing.  NEUROLOGIC: Cranial nerves II through XII are intact. Muscle strength 5/5 in all extremities. Sensation intact. Gait not checked.  PSYCHIATRIC: The patient is alert and oriented x 3.  SKIN: No obvious rash, lesion, or ulcer.   DATA REVIEW:   CBC  Recent Labs  Lab 09/06/17 0448  WBC 20.9*  HGB 13.6  HCT 40.2  PLT 189    Chemistries  Recent Labs  Lab 09/06/17 0448  NA  135  K 3.6  CL 101  CO2 21*  GLUCOSE 141*  BUN 25*  CREATININE 1.42*  CALCIUM 8.7*    Microbiology Results   No results found for this or any previous visit (from the past 240 hour(s)).  RADIOLOGY:  Dg Chest 2 View  Result Date: 09/05/2017 CLINICAL DATA:  Two falls today. Dizziness since last night. Cough and elevated white cell count. EXAM: CHEST  2 VIEW COMPARISON:  None. FINDINGS: Normal heart size and pulmonary vascularity. No focal airspace disease or consolidation in the lungs. No blunting of costophrenic angles. No pneumothorax. Mediastinal contours appear intact. Degenerative changes in the spine. Calcification of the aorta. IMPRESSION: No evidence of active pulmonary disease.  Aortic atherosclerosis. Electronically Signed   By: Burman NievesWilliam  Stevens M.D.   On: 09/05/2017 22:16     Management plans discussed with the patient, family and they are in agreement.  CODE STATUS:     Code Status Orders  (From admission, onward)        Start     Ordered   09/05/17 2329  Full code  Continuous     09/05/17 2328    Code Status History    Date Active Date Inactive Code Status Order ID Comments User Context   This patient has a current code status but no historical code status.      TOTAL TIME TAKING CARE OF THIS PATIENT: 40 minutes.    Enedina FinnerSona Kaylena Pacifico M.D on 09/06/2017 at 1:45 PM  Between 7am to 6pm - Pager - [phone] After 6pm go to [email or web address] - Social [email or web address], [email or web address]  [facility]  Office  [phone]  CC: Primary care physician; Floreen ComberKimel-Scott, Karen, MD

## 2017-09-06 NOTE — H&P (Signed)
[facility] [place] at [facility]   PATIENT NAME: Lindsay LeavensLinda Lopez    MR#:  [id number]  DATE OF BIRTH:  1955-11-10  DATE OF ADMISSION:  09/05/2017  PRIMARY CARE PHYSICIAN: Floreen ComberKimel-Scott, Karen, MD   REQUESTING/REFERRING PHYSICIAN:   CHIEF COMPLAINT:   Chief Complaint  Patient presents with  . Fall  . Dizziness    HISTORY OF PRESENT ILLNESS: Lindsay LeavensLinda Lopez  is a 62 y.o. female with a known history of coronary artery disease status post MI and stents in 2010, diabetes type 2, hypertension and hyperlipidemia. Patient was brought to emergency room for generalized weakness going on for the past few weeks, gradually getting worse.  She also had 3 mechanical falls today while she was sitting at the side of the bed. She felt that her legs were giving out and she fell on her bottom.  She denied any head injury no loss of consciousness; no abdominal pain, chest pain, no fever/chills, no nausea, vomiting, diarrhea or bleeding.  Patient does admit to poor appetite the past few weeks.  No new medications, no sick contacts, no recent travel.  She denies being depressed.  She lives at home with her son. In the emergency room, she was noted with urinary tract infection and elevated WBC at 18,000.  The creatinine level is elevated as well at 1.44.  Chest x-rays negative for any acute changes. Patient is admitted for further evaluation and treatment.  PAST MEDICAL HISTORY:   Past Medical History:  Diagnosis Date  . Hypertension   . MI (myocardial infarction) (HCC)    2010    PAST SURGICAL HISTORY: History reviewed. No pertinent surgical history.  SOCIAL HISTORY:  Social History   Tobacco Use  . Smoking status: Current Every Day Smoker    Packs/day: 0.50  . Smokeless tobacco: Never Used  Substance Use Topics  . Alcohol use: No    FAMILY HISTORY: No family history on file.  DRUG ALLERGIES:  Allergies  Allergen Reactions  . Propoxyphene Other (See Comments)    Sees  little things    REVIEW OF SYSTEMS:   CONSTITUTIONAL: No fever/chills.  Patient complains of fatigue and generalized weakness.  EYES: No blurred or double vision.  EARS, NOSE, AND THROAT: No tinnitus or ear pain.  RESPIRATORY: No cough, shortness of breath, wheezing or hemoptysis.  CARDIOVASCULAR: No chest pain, orthopnea, edema.  GASTROINTESTINAL: Positive for poor appetite and poor p.o. intake in the past few weeks; no nausea, vomiting, diarrhea or abdominal pain.  GENITOURINARY: No dysuria, hematuria.  ENDOCRINE: No polyuria, nocturia,  HEMATOLOGY: No anemia, easy bruising or bleeding SKIN: No rash or lesion. MUSCULOSKELETAL: Positive for osteoarthritis.   NEUROLOGIC: No focal weakness.  PSYCHIATRY: No anxiety or depression.   MEDICATIONS AT HOME:  Prior to Admission medications   Medication Sig Start Date End Date Taking? Authorizing Provider  acetaminophen (TYLENOL) 500 MG tablet Take 500 mg by mouth every 6 (six) hours as needed.   Yes [provider]  aspirin EC 81 MG tablet Take 81 mg by mouth daily.   Yes [provider]  Dulaglutide (TRULICITY [place]) Inject 1 Dose into the skin once a week.   Yes [provider]  enalapril (VASOTEC) 20 MG tablet Take 20 mg by mouth 2 (two) times daily.   Yes [provider]  gabapentin (NEURONTIN) 300 MG capsule Take 300 mg by mouth 3 (three) times daily.   Yes [provider]  glimepiride (AMARYL) 4 MG tablet Take 4  mg by mouth 2 (two) times daily.   Yes [provider]  hydrochlorothiazide (MICROZIDE) 12.5 MG capsule Take 12.5 mg by mouth daily.   Yes [provider]  insulin aspart (NOVOLOG) 100 UNIT/ML injection Inject 24 Units into the skin 2 (two) times daily.   Yes [provider]  insulin glargine (LANTUS) 100 UNIT/ML injection Inject 50 Units into the skin at bedtime.   Yes [provider]  levETIRAcetam (KEPPRA) 750 MG tablet Take 750 mg by mouth 2 (two)  times daily.   Yes [provider]  levocetirizine (XYZAL) 5 MG tablet Take 5 mg by mouth every evening.   Yes [provider]  metFORMIN (GLUCOPHAGE) 500 MG tablet Take 500 mg by mouth every other day.   Yes [provider]  metoprolol tartrate (LOPRESSOR) 100 MG tablet Take 100 mg by mouth 2 (two) times daily.   Yes [provider]  PARoxetine (PAXIL) 20 MG tablet Take 20 mg by mouth daily.   Yes [provider]  pravastatin (PRAVACHOL) 80 MG tablet Take 80 mg by mouth daily.   Yes [provider]      PHYSICAL EXAMINATION:   VITAL SIGNS: Blood pressure 140/63, pulse 96, temperature 97.9 F (36.6 C), temperature source Oral, resp. rate (!) 22, height 5\' 8"  (1.727 m), weight 99.8 kg (220 lb), SpO2 100 %.  GENERAL:  62 y.o.-year-old patient lying in the bed with no acute distress.  EYES: Pupils equal, round, reactive to light and accommodation. No scleral icterus. Extraocular muscles intact.  HEENT: Head atraumatic, normocephalic. Oropharynx and nasopharynx clear.  NECK:  Supple, no jugular venous distention. No thyroid enlargement, no tenderness.  LUNGS: Normal breath sounds bilaterally, no wheezing, rales,rhonchi or crepitation. No use of accessory muscles of respiration.  CARDIOVASCULAR: S1, S2 normal. No 3/S4.  ABDOMEN: Soft, nontender, nondistended. Bowel sounds present. No organomegaly or mass.  EXTREMITIES: No pedal edema, cyanosis, or clubbing.  NEUROLOGIC: No focal weakness gait not checked.  PSYCHIATRIC: The patient is alert and oriented x 3.  SKIN: No obvious rash, lesion, or ulcer.   LABORATORY PANEL:   CBC Recent Labs  Lab 09/05/17 1814  WBC 18.8*  HGB 15.3  HCT 45.3  PLT 254  MCV 85.2  MCH 28.8  MCHC 33.8  RDW 13.4   ------------------------------------------------------------------------------------------------------------------  Chemistries  Recent Labs  Lab 09/05/17 1814  NA 136  K 3.7  CL 102   CO2 22  GLUCOSE 87  BUN 20  CREATININE 1.44*  CALCIUM 9.6   ------------------------------------------------------------------------------------------------------------------ estimated creatinine clearance is 50.7 mL/min (A) (by C-G formula based on SCr of 1.44 mg/dL (H)). ------------------------------------------------------------------------------------------------------------------ No results for input(s): TSH, T4TOTAL, T3FREE, THYROIDAB in the last 72 hours.  Invalid input(s): FREET3   Coagulation profile No results for input(s): INR, PROTIME in the last 168 hours. ------------------------------------------------------------------------------------------------------------------- No results for input(s): DDIMER in the last 72 hours. -------------------------------------------------------------------------------------------------------------------  Cardiac Enzymes Recent Labs  Lab 09/05/17 1814  TROPONINI <0.03   ------------------------------------------------------------------------------------------------------------------ Invalid input(s): POCBNP  ---------------------------------------------------------------------------------------------------------------  Urinalysis    Component Value Date/Time   COLORURINE AMBER (A) 09/05/2017 1814   APPEARANCEUR CLOUDY (A) 09/05/2017 1814   LABSPEC 1.026 09/05/2017 1814   PHURINE 5.0 09/05/2017 1814   GLUCOSEU NEGATIVE 09/05/2017 1814   HGBUR MODERATE (A) 09/05/2017 1814   BILIRUBINUR NEGATIVE 09/05/2017 1814   KETONESUR NEGATIVE 09/05/2017 1814   PROTEINUR 100 (A) 09/05/2017 1814   NITRITE NEGATIVE 09/05/2017 1814   LEUKOCYTESUR NEGATIVE 09/05/2017 1814     RADIOLOGY:  Dg Chest 2 View  Result Date: 09/05/2017 CLINICAL DATA:  Two falls today. Dizziness since last night. Cough and elevated white cell count. EXAM: CHEST  2 VIEW COMPARISON:  None. FINDINGS: Normal heart size and pulmonary vascularity. No focal airspace  disease or consolidation in the lungs. No blunting of costophrenic angles. No pneumothorax. Mediastinal contours appear intact. Degenerative changes in the spine. Calcification of the aorta. IMPRESSION: No evidence of active pulmonary disease.  Aortic atherosclerosis. Electronically Signed   By: Burman Nieves M.D.   On: 09/05/2017 22:16    EKG: Orders placed or performed during the hospital encounter of 09/05/17  . ED EKG  . ED EKG  . ED EKG  . ED EKG  . EKG 12-Lead  . EKG 12-Lead    IMPRESSION AND PLAN:  1.  Acute renal failure, likely prerenal, secondary to poor hydration.  We will start gentle IV hydration and encourage patient to increase p.o. fluid intake.  We will monitor kidney function closely.  Avoid nephrotoxic medications. 2.  UTI we will start treatment with ceftriaxone, IV, while waiting for urine culture results. 3.  Coronary artery disease, stable, continue medical management. 4.  Diabetes type 2, stable.  Blood sugar in the emergency room was only 99.  Will adjust home medications to avoid hypoglycemia.  Continue to monitor blood sugars before meals and at bedtime. 5.  Generalized weakness and deconditioning, worsening secondary to acute illness; will have PT and OT evaluate and treat the patient.   All the records are reviewed and case discussed with ED provider. Management plans discussed with the patient, family and they are in agreement.  CODE STATUS:    Code Status Orders  (From admission, onward)        Start     Ordered   09/05/17 2329  Full code  Continuous     09/05/17 2328    Code Status History    Date Active Date Inactive Code Status Order ID Comments User Context   This patient has a current code status but no historical code status.       TOTAL TIME TAKING CARE OF THIS PATIENT: 40 minutes.    Cammy Copa M.D on 09/06/2017 at 2:07 AM  Between 7am to 6pm - Pager - [phone]  After 6pm go to [email or web address] - password [email or web address]  [place]  [place] [facility]  Office  [phone]  CC: Primary care physician; Floreen Comber, MD

## 2017-09-06 NOTE — Care Management (Signed)
PT is recommending front-wheeled walker. RNCM spoke with patient and she agrees. I [place] [facility] to patient prior to discharge.

## 2017-09-06 NOTE — Progress Notes (Signed)
Discharge teaching given to patient, patient verbalized understanding and had no questions. Patient IV removed. Patient will be transported home by family. All patient belongings gathered prior to leaving.  

## 2017-09-06 NOTE — Evaluation (Signed)
Physical Therapy Evaluation Patient Details Name: Lindsay Lopez MRN: [id number] DOB: 1956/02/06 Today's Date: 09/06/2017   History of Present Illness   Lindsay Lopez  is a 62 y.o. female with a known history of coronary artery disease status post MI and stents in 2010, diabetes type 2, hypertension and hyperlipidemia.  In the emergency room, she was noted with urinary tract infection and elevated WBC at 18,000.  The creatinine level is elevated as well at 1.44.  Chest x-rays negative for any acute changes.    Clinical Impression  Pt presents to PT near baseline functional mobility requiring use of RW during ambulation.  Pt does not ambulate with device at baseline, however used one during gait for stability.  Pt reports she slid off the bed twice and used EMS to get up off the floor as she was unable due to weakness and was brought to the hospital after her second fall.  Pt reports she feels stronger now and but does feel a little light headed upon sitting.  Pt instructed in rising slowly to sitting/standing to avoid passing out.  While inpatient will continue to work on functional strength and prevent further decline in order to return home upon discharge    Follow Up Recommendations No PT follow up    Equipment Recommendations  Rolling walker with 5" wheels    Recommendations for Other Services       Precautions / Restrictions Precautions Precautions: Fall Precaution Comments: slipped out of bed x2 prior to admission Restrictions Weight Bearing Restrictions: No      Mobility  Bed Mobility Overal bed mobility: Modified Independent    General bed mobility comments: Increased time/effort to rise to sitting and to get legs off bed, pt reports extra effort due to discomfort from boil.  Transfers Overall transfer level: Modified independent Equipment used: None  General transfer comment: Rising slowly from seated position, lift off from bed on first attempt, using B UE's  assist  Ambulation/Gait Ambulation/Gait assistance: Modified independent (Device/Increase time) Ambulation Distance (Feet): 150 Feet Assistive device: Rolling walker (2 wheeled) Gait Pattern/deviations: WFL(Within Functional Limits)     General Gait Details: Pt initiated ambulation holding onto IV pole, then used RW for remainder of gait distance.  Pt's gait slow but steady with no balance or gait deviations observed.  Good safety awareness.  Stairs   Wheelchair Mobility    Modified Rankin (Stroke Patients Only)       Balance Overall balance assessment: Modified Independent        Pertinent Vitals/Pain Pain Assessment: (Reports pain between legs from boil.)    Home Living Family/patient expects to be discharged to:: Private residence Living Arrangements: Children(son) Available Help at Discharge: Family(Son, limited) Type of Home: House Home Access: Stairs to enter   [employer] of Steps: 6 Home Layout: One level Home Equipment: None      Prior Function Level of Independence: Independent         Comments: Pt reports that she drives and is able to do her own shopping, cooking, cleaning, bathing, and dressing independently.     Hand Dominance        Extremity/Trunk Assessment   Upper Extremity Assessment Upper Extremity Assessment: Overall WFL for tasks assessed    Lower Extremity Assessment Lower Extremity Assessment: Overall WFL for tasks assessed    Cervical / Trunk Assessment Cervical / Trunk Assessment: Normal  Communication   Communication: No difficulties  Cognition Arousal/Alertness: Awake/alert Behavior During Therapy: WFL for tasks assessed/performed Overall  Cognitive Status: Within Functional Limits for tasks assessed      General Comments: Plesant and answers all questions appropriately.      General Comments General comments (skin integrity, edema, etc.): visible areas intact    Exercises     Assessment/Plan     PT Assessment Patient needs continued PT services  PT Problem List Decreased activity tolerance;Decreased knowledge of use of DME       PT Treatment Interventions DME instruction;Gait training;Stair training;Functional mobility training;Therapeutic activities;Therapeutic exercise;Balance training;Patient/family education    PT Goals (Current goals can be found in the Care Plan section)  Acute Rehab PT Goals Patient Stated Goal: To go home. PT Goal Formulation: With patient Time For Goal Achievement: 09/20/17 Potential to Achieve Goals: Good    Frequency Min 2X/week   Barriers to discharge   None    Co-evaluation        AM-PAC PT "6 Clicks" Daily Activity  Outcome Measure Difficulty turning over in bed (including adjusting bedclothes, sheets and blankets)?: None Difficulty moving from lying on back to sitting on the side of the bed? : A Little Difficulty sitting down on and standing up from a chair with arms (e.g., wheelchair, bedside commode, etc,.)?: None       6 Click Score: 11    End of Session   Activity Tolerance: Patient tolerated treatment well Patient left: in bed;with call bell/phone within reach Nurse Communication: Mobility status PT Visit Diagnosis: History of falling (Z91.81)    Time: 1255-1320 PT Time Calculation (min) (ACUTE ONLY): 25 min   Charges:   PT Evaluation $PT Eval Low Complexity: 1 Low PT Treatments $Therapeutic Activity: 8-22 mins   PT G Codes:        Lindsay Lopez A Lindsay Lopez, PT 09/06/2017, 1:28 PM

## 2017-09-07 LAB — URINE CULTURE: Culture: <10000 — AB

## 2020-12-30 ENCOUNTER — Emergency Department: Payer: Medicare HMO

## 2020-12-30 ENCOUNTER — Other Ambulatory Visit: Payer: Self-pay

## 2020-12-30 ENCOUNTER — Observation Stay
Admission: EM | Admit: 2020-12-30 | Discharge: 2020-12-31 | Disposition: A | Discharge status: Home / Self Care | Payer: Medicare HMO | Attending: Internal Medicine | Admitting: Internal Medicine

## 2020-12-30 ENCOUNTER — Encounter: Payer: Self-pay | Admitting: *Deleted

## 2020-12-30 DIAGNOSIS — Z79899 Other long term (current) drug therapy: Secondary | ICD-10-CM | POA: Diagnosis not present

## 2020-12-30 DIAGNOSIS — Y900 Blood alcohol level of less than 20 mg/100 ml: Secondary | ICD-10-CM | POA: Diagnosis not present

## 2020-12-30 DIAGNOSIS — F17210 Nicotine dependence, cigarettes, uncomplicated: Secondary | ICD-10-CM | POA: Diagnosis not present

## 2020-12-30 DIAGNOSIS — Z7982 Long term (current) use of aspirin: Secondary | ICD-10-CM | POA: Diagnosis not present

## 2020-12-30 DIAGNOSIS — Z7984 Long term (current) use of oral hypoglycemic drugs: Secondary | ICD-10-CM | POA: Insufficient documentation

## 2020-12-30 DIAGNOSIS — Z20822 Contact with and (suspected) exposure to covid-19: Secondary | ICD-10-CM | POA: Insufficient documentation

## 2020-12-30 DIAGNOSIS — R410 Disorientation, unspecified: Secondary | ICD-10-CM

## 2020-12-30 DIAGNOSIS — I2510 Atherosclerotic heart disease of native coronary artery without angina pectoris: Secondary | ICD-10-CM | POA: Diagnosis not present

## 2020-12-30 DIAGNOSIS — I10 Essential (primary) hypertension: Secondary | ICD-10-CM | POA: Insufficient documentation

## 2020-12-30 DIAGNOSIS — Z794 Long term (current) use of insulin: Secondary | ICD-10-CM | POA: Insufficient documentation

## 2020-12-30 DIAGNOSIS — R4182 Altered mental status, unspecified: Secondary | ICD-10-CM | POA: Diagnosis present

## 2020-12-30 DIAGNOSIS — G9340 Encephalopathy, unspecified: Secondary | ICD-10-CM | POA: Diagnosis not present

## 2020-12-30 LAB — URINALYSIS, COMPLETE (UACMP) WITH MICROSCOPIC
Bilirubin Urine: NEGATIVE
Glucose, UA: NEGATIVE mg/dL
Hgb urine dipstick: NEGATIVE
Ketones, ur: NEGATIVE mg/dL
Nitrite: POSITIVE — AB
Protein, ur: 100 mg/dL — AB
Specific Gravity, Urine: 1.025 (ref 1.005–1.030)
pH: 5 (ref 5.0–8.0)

## 2020-12-30 LAB — COMPREHENSIVE METABOLIC PANEL
ALT: 26 U/L (ref 0–44)
AST: 32 U/L (ref 15–41)
Albumin: 4.2 g/dL (ref 3.5–5.0)
Alkaline Phosphatase: 89 U/L (ref 38–126)
Anion gap: 11 (ref 5–15)
BUN: 15 mg/dL (ref 8–23)
CO2: 23 mmol/L (ref 22–32)
Calcium: 9.3 mg/dL (ref 8.9–10.3)
Chloride: 103 mmol/L (ref 98–111)
Creatinine, Ser: 0.9 mg/dL (ref 0.44–1.00)
GFR, Estimated: >60 mL/min (ref >60)
Glucose, Bld: 119 mg/dL — ABNORMAL HIGH (ref 70–99)
Potassium: 4.2 mmol/L (ref 3.5–5.1)
Sodium: 137 mmol/L (ref 135–145)
Total Bilirubin: 1.3 mg/dL — ABNORMAL HIGH (ref 0.3–1.2)
Total Protein: 7.5 g/dL (ref 6.5–8.1)

## 2020-12-30 LAB — CBC WITH DIFFERENTIAL/PLATELET
Abs Immature Granulocytes: 0.03 10*3/uL (ref 0.00–0.07)
Basophils Absolute: 0 10*3/uL (ref 0.0–0.1)
Basophils Relative: 0 %
Eosinophils Absolute: 0 10*3/uL (ref 0.0–0.5)
Eosinophils Relative: 0 %
HCT: 46.1 % — ABNORMAL HIGH (ref 36.0–46.0)
Hemoglobin: 15.3 g/dL — ABNORMAL HIGH (ref 12.0–15.0)
Immature Granulocytes: 0 %
Lymphocytes Relative: 30 %
Lymphs Abs: 2.4 10*3/uL (ref 0.7–4.0)
MCH: 29.4 pg (ref 26.0–34.0)
MCHC: 33.2 g/dL (ref 30.0–36.0)
MCV: 88.7 fL (ref 80.0–100.0)
Monocytes Absolute: 0.8 10*3/uL (ref 0.1–1.0)
Monocytes Relative: 10 %
Neutro Abs: 4.9 10*3/uL (ref 1.7–7.7)
Neutrophils Relative %: 60 %
Platelets: 188 10*3/uL (ref 150–400)
RBC: 5.2 MIL/uL — ABNORMAL HIGH (ref 3.87–5.11)
RDW: 12.8 % (ref 11.5–15.5)
WBC: 8.1 10*3/uL (ref 4.0–10.5)
nRBC: 0 % (ref 0.0–0.2)

## 2020-12-30 LAB — CBG MONITORING, ED: Glucose-Capillary: 100 mg/dL — ABNORMAL HIGH (ref 70–99)

## 2020-12-30 LAB — URINE DRUG SCREEN, QUALITATIVE (ARMC ONLY)
Amphetamines, Ur Screen: NOT DETECTED
Barbiturates, Ur Screen: NOT DETECTED
Benzodiazepine, Ur Scrn: POSITIVE — AB
Cannabinoid 50 Ng, Ur ~~LOC~~: NOT DETECTED
Cocaine Metabolite,Ur ~~LOC~~: NOT DETECTED
MDMA (Ecstasy)Ur Screen: NOT DETECTED
Methadone Scn, Ur: NOT DETECTED
Opiate, Ur Screen: NOT DETECTED
Phencyclidine (PCP) Ur S: NOT DETECTED
Tricyclic, Ur Screen: NOT DETECTED

## 2020-12-30 LAB — BLOOD GAS, VENOUS
Acid-Base Excess: 0.7 mmol/L (ref 0.0–2.0)
Bicarbonate: 27.5 mmol/L (ref 20.0–28.0)
O2 Saturation: 66.5 %
Patient temperature: 37
pCO2, Ven: 51 mmHg (ref 44.0–60.0)
pH, Ven: 7.34 (ref 7.250–7.430)
pO2, Ven: 37 mmHg (ref 32.0–45.0)

## 2020-12-30 LAB — RESP PANEL BY RT-PCR (FLU A&B, COVID) ARPGX2
Influenza A by PCR: NEGATIVE
Influenza B by PCR: NEGATIVE
SARS Coronavirus 2 by RT PCR: NEGATIVE

## 2020-12-30 LAB — TROPONIN I (HIGH SENSITIVITY)
Troponin I (High Sensitivity): <2 ng/L (ref <18)
Troponin I (High Sensitivity): <2 ng/L (ref <18)

## 2020-12-30 LAB — LACTIC ACID, PLASMA
Lactic Acid, Venous: 1.4 mmol/L (ref 0.5–1.9)
Lactic Acid, Venous: 2.1 mmol/L (ref 0.5–1.9)

## 2020-12-30 LAB — ETHANOL: Alcohol, Ethyl (B): <10 mg/dL (ref <10)

## 2020-12-30 MED ORDER — METOPROLOL TARTRATE 50 MG PO TABS
100.0000 mg | ORAL_TABLET | Freq: Two times a day (BID) | ORAL | Status: DC
  Administered 2020-12-31: 100 mg via ORAL
  Filled 2020-12-30: qty 2

## 2020-12-30 MED ORDER — ONDANSETRON HCL 4 MG/2ML IJ SOLN
4.0000 mg | Freq: Once | INTRAMUSCULAR | Status: AC
  Administered 2020-12-30: 4 mg via INTRAVENOUS
  Filled 2020-12-30: qty 2

## 2020-12-30 MED ORDER — SODIUM CHLORIDE 0.9 % IV BOLUS
1000.0000 mL | Freq: Once | INTRAVENOUS | Status: AC
  Administered 2020-12-30: 1000 mL via INTRAVENOUS

## 2020-12-30 MED ORDER — POLYETHYLENE GLYCOL 3350 17 G PO PACK: 17.0000 g | PACK | Freq: Every day | ORAL | Status: DC | PRN

## 2020-12-30 MED ORDER — ENOXAPARIN SODIUM 60 MG/0.6ML IJ SOSY
0.5000 mg/kg | PREFILLED_SYRINGE | INTRAMUSCULAR | Status: DC
  Administered 2020-12-31: 52.5 mg via SUBCUTANEOUS
  Filled 2020-12-30: qty 0.6

## 2020-12-30 MED ORDER — HALOPERIDOL LACTATE 5 MG/ML IJ SOLN
INTRAMUSCULAR | Status: AC
  Administered 2020-12-30: 2.5 mg via INTRAVENOUS
  Filled 2020-12-30: qty 1

## 2020-12-30 MED ORDER — DEXTROSE 5 % IV SOLN: Freq: Once | INTRAVENOUS | Status: AC

## 2020-12-30 MED ORDER — PAROXETINE HCL 20 MG PO TABS
20.0000 mg | ORAL_TABLET | Freq: Every day | ORAL | Status: DC
  Administered 2020-12-31: 20 mg via ORAL
  Filled 2020-12-30: qty 1

## 2020-12-30 MED ORDER — DEXTROSE-NACL 5-0.9 % IV SOLN: INTRAVENOUS | Status: DC

## 2020-12-30 MED ORDER — ENALAPRIL MALEATE 10 MG PO TABS
20.0000 mg | ORAL_TABLET | Freq: Every day | ORAL | Status: DC
  Administered 2020-12-31: 20 mg via ORAL
  Filled 2020-12-30: qty 2

## 2020-12-30 MED ORDER — LEVETIRACETAM 750 MG PO TABS
750.0000 mg | ORAL_TABLET | Freq: Two times a day (BID) | ORAL | Status: DC
  Administered 2020-12-31 (×2): 750 mg via ORAL
  Filled 2020-12-30 (×3): qty 1

## 2020-12-30 MED ORDER — GABAPENTIN 300 MG PO CAPS
300.0000 mg | ORAL_CAPSULE | Freq: Three times a day (TID) | ORAL | Status: DC
  Administered 2020-12-31: 300 mg via ORAL
  Filled 2020-12-30: qty 1

## 2020-12-30 MED ORDER — ASPIRIN EC 81 MG PO TBEC
81.0000 mg | DELAYED_RELEASE_TABLET | Freq: Every day | ORAL | Status: DC
  Administered 2020-12-31: 81 mg via ORAL
  Filled 2020-12-30: qty 1

## 2020-12-30 MED ORDER — ONDANSETRON HCL 4 MG PO TABS: 4.0000 mg | ORAL_TABLET | Freq: Four times a day (QID) | ORAL | Status: DC | PRN

## 2020-12-30 MED ORDER — HALOPERIDOL LACTATE 5 MG/ML IJ SOLN: 2.5000 mg | Freq: Once | INTRAMUSCULAR | Status: AC

## 2020-12-30 MED ORDER — INSULIN ASPART 100 UNIT/ML IJ SOLN
0.0000 [IU] | Freq: Three times a day (TID) | INTRAMUSCULAR | Status: DC
  Administered 2020-12-31: 2 [IU] via SUBCUTANEOUS
  Filled 2020-12-30: qty 1

## 2020-12-30 MED ORDER — ACETAMINOPHEN 500 MG PO TABS: 500.0000 mg | ORAL_TABLET | Freq: Four times a day (QID) | ORAL | Status: DC | PRN

## 2020-12-30 MED ORDER — ONDANSETRON HCL 4 MG/2ML IJ SOLN: 4.0000 mg | Freq: Four times a day (QID) | INTRAMUSCULAR | Status: DC | PRN

## 2020-12-30 MED ORDER — PRAVASTATIN SODIUM 40 MG PO TABS
80.0000 mg | ORAL_TABLET | Freq: Every day | ORAL | Status: DC
  Administered 2020-12-31: 80 mg via ORAL
  Filled 2020-12-30: qty 2

## 2020-12-30 NOTE — ED Notes (Signed)
Report off to yareli rn

## 2020-12-30 NOTE — ED Notes (Signed)
Dr. Margette Fast at bedside

## 2020-12-30 NOTE — H&P (Addendum)
.   Chief Complaint: Patient was brought in on account of altered mental status from baseline. HPI: Patient is a poor historian due to her condition.  Unable to obtain any appropriate history from patient.  Patient primary contact, nephew/Travis was contacted and is equally a poor historian.  Lindsay Lopez is an 66 y.o. female with documented history of hypertension,CAD, diabetes mellitus on insulin and sulfonylureas.  Patient was brought in this evening to the ED by EMS after patient was reported to be altered in mental status from her baseline.  Patient was reported to be talking out of her head as per nephew Feliz Beam.  No reported loss of consciousness.  No reported falls.  When EMS arrived, patient was reported to be hypoglycemic with blood glucose of 59.  Dextrose was offered with improvement.  Patient was reported to have vomited after dextrose was given.  She became combative in the ED with no obvious focal deficit.  Currently doing better and sleeping.  She is arousable but not cooperative with history and physical examination.  Review of records suggest last admission was in 2019.  Her med rec includes Keppra.  Unclear if patient has history of seizure disorder.  No reported seizures.  Pharmacy to help reconcile home medications.  In the ED, CT head was unremarkable.  Chest x-ray was unremarkable.  Labs reviewed were unremarkable.  COVID test was also negative.  Past Medical History:  Diagnosis Date  . Hypertension   . MI (myocardial infarction) (HCC)    2010    No past surgical history on file.  No family history on file. Social History:  reports that she has been smoking. She has been smoking about 0.50 packs per day. She has never used smokeless tobacco. She reports that she does not drink alcohol and does not use drugs.  Allergies:  Allergies  Allergen Reactions  . Propoxyphene Other (See Comments)    Sees little things    (Not in a hospital admission)   Results for  orders placed or performed during the hospital encounter of 12/30/20 (from the past 48 hour(s))  CBC with Differential     Status: Abnormal   Collection Time: 12/30/20  7:00 PM  Result Value Ref Range   WBC 8.1 4.0 - 10.5 K/uL   RBC 5.20 (H) 3.87 - 5.11 MIL/uL   Hemoglobin 15.3 (H) 12.0 - 15.0 g/dL   HCT 08.6 (H) 57.8 - 46.9 %   MCV 88.7 80.0 - 100.0 fL   MCH 29.4 26.0 - 34.0 pg   MCHC 33.2 30.0 - 36.0 g/dL   RDW 62.9 52.8 - 41.3 %   Platelets 188 150 - 400 K/uL   nRBC 0.0 0.0 - 0.2 %   Neutrophils Relative % 60 %   Neutro Abs 4.9 1.7 - 7.7 K/uL   Lymphocytes Relative 30 %   Lymphs Abs 2.4 0.7 - 4.0 K/uL   Monocytes Relative 10 %   Monocytes Absolute 0.8 0.1 - 1.0 K/uL   Eosinophils Relative 0 %   Eosinophils Absolute 0.0 0.0 - 0.5 K/uL   Basophils Relative 0 %   Basophils Absolute 0.0 0.0 - 0.1 K/uL   Immature Granulocytes 0 %   Abs Immature Granulocytes 0.03 0.00 - 0.07 K/uL    Comment: Performed at [facility], [address], [place], [place] [address]  Comprehensive metabolic panel     Status: Abnormal   Collection Time: 12/30/20  7:00 PM  Result Value Ref Range  Sodium 137 135 - 145 mmol/L   Potassium 4.2 3.5 - 5.1 mmol/L    Comment: HEMOLYSIS AT THIS LEVEL MAY AFFECT RESULT   Chloride 103 98 - 111 mmol/L   CO2 23 22 - 32 mmol/L   Glucose, Bld 119 (H) 70 - 99 mg/dL    Comment: Glucose reference range applies only to samples taken after fasting for at least 8 hours.   BUN 15 8 - 23 mg/dL   Creatinine, Ser 1.610.90 0.44 - 1.00 mg/dL   Calcium 9.3 8.9 - 09.610.3 mg/dL   Total Protein 7.5 6.5 - 8.1 g/dL   Albumin 4.2 3.5 - 5.0 g/dL   AST 32 15 - 41 U/L    Comment: HEMOLYSIS AT THIS LEVEL MAY AFFECT RESULT   ALT 26 0 - 44 U/L   Alkaline Phosphatase 89 38 - 126 U/L   Total Bilirubin 1.3 (H) 0.3 - 1.2 mg/dL    Comment: HEMOLYSIS AT THIS LEVEL MAY AFFECT RESULT   GFR, Estimated >60 >60 mL/min    Comment: (NOTE) Calculated using the CKD-EPI Creatinine Equation  (2021)    Anion gap 11 5 - 15    Comment: Performed at [facility], [address] [place], [place] [address]  Troponin I (High Sensitivity)     Status: None   Collection Time: 12/30/20  7:00 PM  Result Value Ref Range   Troponin I (High Sensitivity) <2 <18 ng/L    Comment: (NOTE) Elevated high sensitivity troponin I (hsTnI) values and significant  changes across serial measurements may suggest ACS but many other  chronic and acute conditions are known to elevate hsTnI results.  Refer to the "Links" section for chest pain algorithms and additional  guidance. Performed at [facility], [address] [place], [place] [address]   Ethanol     Status: None   Collection Time: 12/30/20  7:00 PM  Result Value Ref Range   Alcohol, Ethyl (B) <10 <10 mg/dL    Comment: (NOTE) Lowest detectable limit for serum alcohol is 10 mg/dL.  For medical purposes only. Performed at [facility], [address] [place], [place] [address]   Urine Drug Screen, Qualitative [facility] only)     Status: Abnormal   Collection Time: 12/30/20  7:00 PM  Result Value Ref Range   Tricyclic, Ur Screen NONE DETECTED NONE DETECTED   Amphetamines, Ur Screen NONE DETECTED NONE DETECTED   MDMA (Ecstasy)Ur Screen NONE DETECTED NONE DETECTED   Cocaine Metabolite,Ur [place] NONE DETECTED NONE DETECTED   Opiate, Ur Screen NONE DETECTED NONE DETECTED   Phencyclidine (PCP) Ur S NONE DETECTED NONE DETECTED   Cannabinoid 50 Ng, Ur [place] NONE DETECTED NONE DETECTED   Barbiturates, Ur Screen NONE DETECTED NONE DETECTED   Benzodiazepine, Ur Scrn POSITIVE (A) NONE DETECTED   Methadone Scn, Ur NONE DETECTED NONE DETECTED    Comment: (NOTE) Tricyclics + metabolites, urine    Cutoff 1000 ng/mL Amphetamines + metabolites, urine  Cutoff 1000 ng/mL MDMA (Ecstasy), urine              Cutoff 500 ng/mL Cocaine Metabolite, urine          Cutoff 300 ng/mL Opiate + metabolites, urine        Cutoff 300  ng/mL Phencyclidine (PCP), urine         Cutoff 25 ng/mL Cannabinoid, urine                 Cutoff 50 ng/mL Barbiturates + metabolites,  urine  Cutoff 200 ng/mL Benzodiazepine, urine              Cutoff 200 ng/mL Methadone, urine                   Cutoff 300 ng/mL  The urine drug screen provides only a preliminary, unconfirmed analytical test result and should not be used for non-medical purposes. Clinical consideration and professional judgment should be applied to any positive drug screen result due to possible interfering substances. A more specific alternate chemical method must be used in order to obtain a confirmed analytical result. Gas chromatography / mass spectrometry (GC/MS) is the preferred confirm atory method. Performed at [facility], [address] [place], [place] [address]   Lactic acid, plasma     Status: None   Collection Time: 12/30/20  7:00 PM  Result Value Ref Range   Lactic Acid, Venous 1.4 0.5 - 1.9 mmol/L    Comment: Performed at [facility], [address] [place], [place] [address]  Blood gas, venous     Status: None   Collection Time: 12/30/20  7:55 PM  Result Value Ref Range   pH, Ven 7.34 7.250 - 7.430   pCO2, Ven 51 44.0 - 60.0 mmHg   pO2, Ven 37.0 32.0 - 45.0 mmHg   Bicarbonate 27.5 20.0 - 28.0 mmol/L   Acid-Base Excess 0.7 0.0 - 2.0 mmol/L   O2 Saturation 66.5 %   Patient temperature 37.0    Collection site VEIN    Sample type VENOUS     Comment: Performed at [facility], [address] [place], [place] [address]  POC CBG, ED     Status: Abnormal   Collection Time: 12/30/20  8:48 PM  Result Value Ref Range   Glucose-Capillary 100 (H) 70 - 99 mg/dL    Comment: Glucose reference range applies only to samples taken after fasting for at least 8 hours.  Lactic acid, plasma     Status: Abnormal   Collection Time: 12/30/20  8:49 PM  Result Value Ref Range   Lactic Acid, Venous 2.1 (HH) 0.5 - 1.9 mmol/L     Comment: CRITICAL RESULT CALLED TO, READ BACK BY AND VERIFIED WITH WARERI LUNA AT 2233 ON 12/30/20 BY SS Performed at [facility], [address] [place], [place] [address]   Resp Panel by RT-PCR (Flu A&B, Covid) Nasopharyngeal Swab     Status: None   Collection Time: 12/30/20  8:49 PM   Specimen: Nasopharyngeal Swab; Nasopharyngeal(NP) swabs in vial transport medium  Result Value Ref Range   SARS Coronavirus 2 by RT PCR NEGATIVE NEGATIVE    Comment: (NOTE) SARS-CoV-2 target nucleic acids are NOT DETECTED.  The SARS-CoV-2 RNA is generally detectable in upper respiratory specimens during the acute phase of infection. The lowest concentration of SARS-CoV-2 viral copies this assay can detect is 138 copies/mL. A negative result does not preclude SARS-Cov-2 infection and should not be used as the sole basis for treatment or other patient management decisions. A negative result may occur with  improper specimen collection/handling, submission of specimen other than nasopharyngeal swab, presence of viral mutation(s) within the areas targeted by this assay, and inadequate number of viral copies(<138 copies/mL). A negative result must be combined with clinical observations, patient history, and epidemiological information. The expected result is Negative.  Fact Sheet for Patients:  BloggerCourse.com  Fact Sheet for Healthcare Providers:  SeriousBroker.it  This test is no t yet approved or cleared  by the [place] and  has been authorized for detection and/or diagnosis of SARS-CoV-2 by FDA under an Emergency Use Authorization (EUA). This EUA will remain  in effect (meaning this test can be used) for the duration of the COVID-19 declaration under Section 564(b)(1) of the Act, 21 U.S.C.section 360bbb-3(b)(1), unless the authorization is terminated  or revoked sooner.       Influenza A by PCR NEGATIVE NEGATIVE    Influenza B by PCR NEGATIVE NEGATIVE    Comment: (NOTE) The Xpert Xpress SARS-CoV-2/FLU/RSV plus assay is intended as an aid in the diagnosis of influenza from Nasopharyngeal swab specimens and should not be used as a sole basis for treatment. Nasal washings and aspirates are unacceptable for Xpert Xpress SARS-CoV-2/FLU/RSV testing.  Fact Sheet for Patients: BloggerCourse.com  Fact Sheet for Healthcare Providers: SeriousBroker.it  This test is not yet approved or cleared by the [place] FDA and has been authorized for detection and/or diagnosis of SARS-CoV-2 by FDA under an Emergency Use Authorization (EUA). This EUA will remain in effect (meaning this test can be used) for the duration of the COVID-19 declaration under Section 564(b)(1) of the Act, 21 U.S.C. section 360bbb-3(b)(1), unless the authorization is terminated or revoked.  Performed at [facility], [address] [place], [place] [address]   Troponin I (High Sensitivity)     Status: None   Collection Time: 12/30/20  8:49 PM  Result Value Ref Range   Troponin I (High Sensitivity) <2 <18 ng/L    Comment: (NOTE) Elevated high sensitivity troponin I (hsTnI) values and significant  changes across serial measurements may suggest ACS but many other  chronic and acute conditions are known to elevate hsTnI results.  Refer to the "Links" section for chest pain algorithms and additional  guidance. Performed at [facility], [address] [place], [place] [address]    CT Head Wo Contrast  Result Date: 12/30/2020 CLINICAL DATA:  Delirium EXAM: CT HEAD WITHOUT CONTRAST TECHNIQUE: Contiguous axial images were obtained from the base of the skull through the vertex without intravenous contrast. COMPARISON:  None. FINDINGS: Brain: There is no mass, hemorrhage or extra-axial collection. The size and configuration of the ventricles and extra-axial CSF spaces  are normal. The brain parenchyma is normal, without acute or chronic infarction. Vascular: No abnormal hyperdensity of the major intracranial arteries or dural venous sinuses. No intracranial atherosclerosis. Skull: The visualized skull base, calvarium and extracranial soft tissues are normal. Sinuses/Orbits: No fluid levels or advanced mucosal thickening of the visualized paranasal sinuses. No mastoid or middle ear effusion. The orbits are normal. IMPRESSION: Normal head CT. Electronically Signed   By: Deatra Robinson M.D.   On: 12/30/2020 19:42   DG Chest Portable 1 View  Result Date: 12/30/2020 CLINICAL DATA:  Altered mental status EXAM: PORTABLE CHEST 1 VIEW COMPARISON:  09/05/2017 FINDINGS: Low lung volumes. No pleural effusion or pneumothorax. Normal cardiac size with aortic atherosclerosis. Mild diffuse interstitial opacity. IMPRESSION: 1. No focal airspace disease. 2. Mild diffuse interstitial opacity, question interstitial inflammatory process, less likely edema Electronically Signed   By: Jasmine Pang M.D.   On: 12/30/2020 20:00    Review of Systems  Unable to perform ROS: Acuity of condition (unable to obtain due to encephalopathy)    Blood pressure (!) 148/53, pulse 93, temperature 98.6 F (37 C), temperature source Oral, resp. rate (!) 22, height 5\' 6"  (1.676 m), weight 103 kg, SpO2 99 %. Physical Exam Vitals and nursing note reviewed. Exam  conducted with a chaperone present.  Constitutional:      Appearance: She is obese.     Comments: Uncooperative with physical exam due to confusion and clinical condition. Arousable however   Cardiovascular:     Rate and Rhythm: Normal rate and regular rhythm.  Pulmonary:     Effort: Pulmonary effort is normal.     Breath sounds: Normal breath sounds.  Abdominal:     General: Abdomen is flat. Bowel sounds are normal.     Palpations: Abdomen is soft.  Musculoskeletal:     Cervical back: Normal range of motion.     Comments: Difficult to  asses due to condition   Skin:    General: Skin is warm and dry.  Neurological:     Mental Status: She is disoriented.     Comments: Difficult to assess due to uncooperation   Psychiatric:     Comments: Abnormal judgement      Assessment/Plan Acute encephalopathy: Present on admission.  Likely metabolic in etiology secondary to hypoglycemia.  Hypoglycemia was corrected but patient still not at baseline.  Patient will be continued on D5 normal saline.  Fingerstick glucose monitoring as per protocol.  Other differentials include UTI has urinalysis has been requested and pending.  Patient medications included Keppra.  Unclear if patient still on medication and may have underlying seizures with postictal confusion.  CT of the head was negative.  Acute hypoglycemia in a patient with diabetes Type II: Patient is on sulfonylureas and insulin regimen.  Unable to confirm doses at this time.  We will hold all insulin regimen until medications confirmed.  Fingerstick glucose monitoring as per protocol.  Patient will be continued on D5 normal saline for now.  Send for A1c.  Hypertension: Blood pressure stable.  History of coronary disease: Patient will be resumed on cardioprotective medications when able to tolerate p.o. intake.  Hydralazine as needed for systolic blood pressure greater than 180 mmHg  Mild elevated lactic acidosis: Continue with IV fluids.  Rule out any infectious process.  If urinalysis comes back positive, patient will need to be initiated on IV antibiotics.   Addendum: Urinalysis resulted showing evidence of UTI. Likely the cause of encephalopathy/ Patient will be empirically initiated on abx after [facility] are obtained.  Lilia Pro, MD 12/30/2020, 11:14 PM

## 2020-12-30 NOTE — ED Notes (Signed)
Dr. Erma Heritage notified patient lactic acid 2.1; awaiting orders

## 2020-12-30 NOTE — Progress Notes (Signed)
PHARMACIST - PHYSICIAN COMMUNICATION  CONCERNING:  Enoxaparin (Lovenox) for DVT Prophylaxis    RECOMMENDATION: Patient was prescribed enoxaprin 40mg  q24 hours for VTE prophylaxis.   Filed Weights   12/30/20 1850  Weight: 103 kg (227 lb)    Body mass index is 36.64 kg/m.  Estimated Creatinine Clearance: 76.6 mL/min (by C-G formula based on SCr of 0.9 mg/dL).   Based on [facility] policy patient is candidate for enoxaparin 0.5mg /kg TBW SQ every 24 hours based on BMI being >30.  DESCRIPTION: Pharmacy has adjusted enoxaparin dose per [facility] policy.  Patient is now receiving enoxaparin 0.5 mg/kg every 24 hours   [facility], PharmD, [facility] 12/30/2020 11:32 PM

## 2020-12-30 NOTE — ED Provider Notes (Signed)
[facility] Emergency Department Provider Note  ____________________________________________   Event Date/Time   First MD Initiated Contact with Patient 12/30/20 1854     (approximate)  I have reviewed the triage vital signs and the nursing notes.   HISTORY  Chief Complaint Altered Mental Status    HPI Lindsay Lopez is a 65 y.o. female with history of hypertension, MI, here with altered mental status.   History provided primarily by EMS.  Per report, patient was found by her family this afternoon, confused.  She was on the floor reportedly looking for something that she do not actually have.  She was noted to have a low blood sugar of 59 and was given D10.  She then became more alert but combative.  She vomited once.  Patient has since been confused.  She has not been following commands.  No history of known drug use.  No known trauma.  Patient currently is drowsy but will answer questions although intermittently inappropriately.  Denies any focal numbness or weakness.  Remainder of history limited due to mild confusion.     Level 5 caveat invoked as remainder of history, ROS, and physical exam limited due to patient's confusion.   Past Medical History:  Diagnosis Date  . Hypertension   . MI (myocardial infarction) [facility](HCC)    2010    Patient Active Problem List   Diagnosis Date Noted  . Encephalopathy acute 12/30/2020  . ARF (acute renal failure) (HCC) 09/05/2017    No past surgical history on file.  Prior to Admission medications   Medication Sig Start Date End Date Taking? Authorizing Provider  acetaminophen (TYLENOL) 500 MG tablet Take 500 mg by mouth every 6 (six) hours as needed.    [provider]  aspirin EC 81 MG tablet Take 81 mg by mouth daily.    [provider]  clindamycin (CLEOCIN) 300 MG capsule Take 2 capsules (600 mg total) by mouth every 8 (eight) hours. Patient not taking: Reported on 12/30/2020 09/06/17    Enedina FinnerPatel, Sona, MD  Dulaglutide (TRULICITY [place]) Inject 1 Dose into the skin once a week.    [provider]  Dulaglutide (TRULICITY) 3 MG/0.5ML SOPN Inject 3 mg into the skin every 7 (seven) days. 06/01/20   [provider]  enalapril (VASOTEC) 20 MG tablet Take 1 tablet (20 mg total) by mouth daily. 09/06/17   Enedina FinnerPatel, Sona, MD  gabapentin (NEURONTIN) 300 MG capsule Take 300 mg by mouth 3 (three) times daily.    [provider]  glimepiride (AMARYL) 4 MG tablet Take 4 mg by mouth 2 (two) times daily.    [provider]  hydrochlorothiazide (MICROZIDE) 12.5 MG capsule Take 1 capsule (12.5 mg total) by mouth daily. 09/11/17   Enedina FinnerPatel, Sona, MD  HYDROcodone-acetaminophen (NORCO/VICODIN) 5-325 MG tablet Take 1 tablet by mouth every 6 (six) hours as needed for moderate pain. 09/06/17   Enedina FinnerPatel, Sona, MD  insulin aspart (NOVOLOG) 100 UNIT/ML injection Inject 24 Units into the skin 2 (two) times daily.    [provider]  insulin glargine (LANTUS) 100 UNIT/ML injection Inject 50 Units into the skin at bedtime.    [provider]  levETIRAcetam (KEPPRA) 750 MG tablet Take 750 mg by mouth 2 (two) times daily.    [provider]  levocetirizine (XYZAL) 5 MG tablet Take 5 mg by mouth every evening.    [provider]  metFORMIN (GLUCOPHAGE) 500 MG tablet Take 500 mg by mouth every  other day.    [provider]  metoprolol tartrate (LOPRESSOR) 100 MG tablet Take 100 mg by mouth 2 (two) times daily.    [provider]  PARoxetine (PAXIL) 20 MG tablet Take 20 mg by mouth daily.    [provider]  pravastatin (PRAVACHOL) 80 MG tablet Take 80 mg by mouth daily.    [provider]    Allergies Propoxyphene  No family history on file.  Social History Social History   Tobacco Use  . Smoking status: Current Every Day Smoker    Packs/day: 0.50  . Smokeless tobacco: Never Used  Vaping Use  . Vaping Use: Never  used  Substance Use Topics  . Alcohol use: No  . Drug use: No    Review of Systems  Review of Systems  Unable to perform ROS: Mental status change  Constitutional: Positive for fatigue.  Psychiatric/Behavioral: Positive for confusion.     ____________________________________________  PHYSICAL EXAM:      VITAL SIGNS: ED Triage Vitals [12/30/20 1850]  Enc Vitals Group     BP      Pulse      Resp      Temp      Temp src      SpO2      Weight 227 lb (103 kg)     Height 5\' 6"  (1.676 m)     Head Circumference      Peak Flow      Pain Score 0     Pain Loc      Pain Edu?      Excl. in GC?      Physical Exam Vitals and nursing note reviewed.  Constitutional:      General: She is not in acute distress.    Appearance: She is well-developed.  HENT:     Head: Normocephalic and atraumatic.     Mouth/Throat:     Mouth: Mucous membranes are dry.  Eyes:     Conjunctiva/sclera: Conjunctivae normal.  Cardiovascular:     Rate and Rhythm: Normal rate and regular rhythm.     Heart sounds: Normal heart sounds.  Pulmonary:     Effort: Pulmonary effort is normal. No respiratory distress.     Breath sounds: No wheezing.  Abdominal:     General: There is no distension.  Musculoskeletal:     Cervical back: Neck supple.  Skin:    General: Skin is warm.     Capillary Refill: Capillary refill takes less than 2 seconds.     Findings: No rash.  Neurological:     Mental Status: She is alert. She is disoriented.     GCS: GCS eye subscore is 4. GCS verbal subscore is 5. GCS motor subscore is 6.     Cranial Nerves: No cranial nerve deficit.     Sensory: Sensation is intact.     Motor: Motor function is intact. No abnormal muscle tone.       ____________________________________________   LABS (all labs ordered are listed, but only abnormal results are displayed)  Labs Reviewed  CBC WITH DIFFERENTIAL/PLATELET - Abnormal; Notable for the following components:      Result Value    RBC 5.20 (*)    Hemoglobin 15.3 (*)    HCT 46.1 (*)    All other components within normal limits  COMPREHENSIVE METABOLIC PANEL - Abnormal; Notable for the following components:   Glucose, Bld 119 (*)    Total Bilirubin 1.3 (*)  All other components within normal limits  URINE DRUG SCREEN, QUALITATIVE ([facility] ONLY) - Abnormal; Notable for the following components:   Benzodiazepine, Ur Scrn POSITIVE (*)    All other components within normal limits  LACTIC ACID, PLASMA - Abnormal; Notable for the following components:   Lactic Acid, Venous 2.1 (*)    All other components within normal limits  URINALYSIS, COMPLETE (UACMP) WITH MICROSCOPIC - Abnormal; Notable for the following components:   Color, Urine YELLOW (*)    APPearance CLOUDY (*)    Protein, ur 100 (*)    Nitrite POSITIVE (*)    Leukocytes,Ua SMALL (*)    Bacteria, UA MANY (*)    All other components within normal limits  CBG MONITORING, ED - Abnormal; Notable for the following components:   Glucose-Capillary 100 (*)    All other components within normal limits  RESP PANEL BY RT-PCR (FLU A&B, COVID) ARPGX2  BLOOD GAS, VENOUS  ETHANOL  LACTIC ACID, PLASMA  HIV ANTIBODY (ROUTINE TESTING W REFLEX)  BASIC METABOLIC PANEL  CBC  HEMOGLOBIN A1C  CBG MONITORING, ED  TROPONIN I (HIGH SENSITIVITY)  TROPONIN I (HIGH SENSITIVITY)    ____________________________________________  EKG: Normal sinus rhythm, ventricular 86.  PR 152, QRS 89, QTc 454.  No acute ST elevations or depressions. ________________________________________  RADIOLOGY All imaging, including plain films, CT scans, and ultrasounds, independently reviewed by me, and interpretations confirmed via formal radiology reads.  ED MD interpretation:   CT head: No acute abnormality Chest x-ray: Mild diffuse opacity, question interstitial inflammation  Official radiology report(s): CT Head Wo Contrast  Result Date: 12/30/2020 CLINICAL DATA:  Delirium EXAM: CT HEAD  WITHOUT CONTRAST TECHNIQUE: Contiguous axial images were obtained from the base of the skull through the vertex without intravenous contrast. COMPARISON:  None. FINDINGS: Brain: There is no mass, hemorrhage or extra-axial collection. The size and configuration of the ventricles and extra-axial CSF spaces are normal. The brain parenchyma is normal, without acute or chronic infarction. Vascular: No abnormal hyperdensity of the major intracranial arteries or dural venous sinuses. No intracranial atherosclerosis. Skull: The visualized skull base, calvarium and extracranial soft tissues are normal. Sinuses/Orbits: No fluid levels or advanced mucosal thickening of the visualized paranasal sinuses. No mastoid or middle ear effusion. The orbits are normal. IMPRESSION: Normal head CT. Electronically Signed   By: Deatra Robinson M.D.   On: 12/30/2020 19:42   DG Chest Portable 1 View  Result Date: 12/30/2020 CLINICAL DATA:  Altered mental status EXAM: PORTABLE CHEST 1 VIEW COMPARISON:  09/05/2017 FINDINGS: Low lung volumes. No pleural effusion or pneumothorax. Normal cardiac size with aortic atherosclerosis. Mild diffuse interstitial opacity. IMPRESSION: 1. No focal airspace disease. 2. Mild diffuse interstitial opacity, question interstitial inflammatory process, less likely edema Electronically Signed   By: Jasmine Pang M.D.   On: 12/30/2020 20:00    ____________________________________________  PROCEDURES   Procedure(s) performed (including Critical Care):  Procedures  ____________________________________________  INITIAL IMPRESSION / MDM / ASSESSMENT AND PLAN / ED COURSE  As part of my medical decision making, I reviewed the following data within the electronic MEDICAL RECORD NUMBER Nursing notes reviewed and incorporated, Old chart reviewed, Notes from prior ED visits, and [place] Controlled Substance Database       *SADIA BELFIORE was evaluated in Emergency Department on 12/30/2020 for the symptoms  described in the history of present illness. She was evaluated in the context of the global COVID-19 pandemic, which necessitated consideration that the patient might be at risk for  infection with the SARS-CoV-2 virus that causes COVID-19. Institutional protocols and algorithms that pertain to the evaluation of patients at risk for COVID-19 are in a state of rapid change based on information released by regulatory bodies including the CDC and federal and state organizations. These policies and algorithms were followed during the patient's care in the ED.  Some ED evaluations and interventions may be delayed as a result of limited staffing during the pandemic.*     Medical Decision Making: 65 year old female here with acute altered mental status.  On exam, she was initially very confused but has been increasingly alert in the ED.  No focal deficits.  She was noted to have glucose of 100 which is consistently trending down and she was hypoglycemic at the scene.  According to family report to EMS, she has had issues with her sugars in the past with similar behavior.  Differential includes hypoglycemia, but must also consider possible encephalopathy in the setting of seizure or less likely stroke.  CT head is negative.  Chest x-ray shows possible interstitial opacity although she is not hypoxic and has no evidence of pneumonia.  Urinalysis has been ordered.  Lactic slightly elevated but I suspect this is due to poor p.o. intake.  She is afebrile without signs of sepsis otherwise.  Normal white count.  Will admit for further work-up and follow-up of UA.  ____________________________________________  FINAL CLINICAL IMPRESSION(S) / ED DIAGNOSES  Final diagnoses:  Disorientation     MEDICATIONS GIVEN DURING THIS VISIT:  Medications  acetaminophen (TYLENOL) tablet 500 mg (has no administration in time range)  aspirin EC tablet 81 mg (has no administration in time range)  enalapril (VASOTEC) tablet 20 mg  (has no administration in time range)  metoprolol tartrate (LOPRESSOR) tablet 100 mg (has no administration in time range)  pravastatin (PRAVACHOL) tablet 80 mg (has no administration in time range)  PARoxetine (PAXIL) tablet 20 mg (has no administration in time range)  levETIRAcetam (KEPPRA) tablet 750 mg (has no administration in time range)  gabapentin (NEURONTIN) capsule 300 mg (has no administration in time range)  enoxaparin (LOVENOX) injection 52.5 mg (has no administration in time range)  dextrose 5 %-0.9 % sodium chloride infusion (has no administration in time range)  polyethylene glycol (MIRALAX / GLYCOLAX) packet 17 g (has no administration in time range)  ondansetron (ZOFRAN) tablet 4 mg (has no administration in time range)    Or  ondansetron (ZOFRAN) injection 4 mg (has no administration in time range)  insulin aspart (novoLOG) injection 0-9 Units (has no administration in time range)  ondansetron (ZOFRAN) injection 4 mg (4 mg Intravenous Given 12/30/20 2001)  haloperidol lactate (HALDOL) injection 2.5 mg (2.5 mg Intravenous Given 12/30/20 1930)  dextrose 5 % solution ( Intravenous Stopped 12/30/20 2320)  sodium chloride 0.9 % bolus 1,000 mL (1,000 mLs Intravenous New Bag/Given 12/30/20 2254)     ED Discharge Orders    None       Note:  This document was prepared using Dragon voice recognition software and may include unintentional dictation errors.   Shaune Pollack, MD 12/30/20 [phone]

## 2020-12-30 NOTE — ED Triage Notes (Signed)
Pt brought in via ems from with altered mental status.  Low blood sugar on scene at 59.  d10 given by ems.  Pt became combative on scene, 2mg  versed given.  Pt vomited on scene.  On arrival pt confused trying to get up , going to the bathroom

## 2020-12-31 DIAGNOSIS — N390 Urinary tract infection, site not specified: Secondary | ICD-10-CM

## 2020-12-31 DIAGNOSIS — E1165 Type 2 diabetes mellitus with hyperglycemia: Secondary | ICD-10-CM | POA: Diagnosis not present

## 2020-12-31 DIAGNOSIS — G9340 Encephalopathy, unspecified: Secondary | ICD-10-CM | POA: Diagnosis not present

## 2020-12-31 LAB — BASIC METABOLIC PANEL
Anion gap: 8 (ref 5–15)
BUN: 14 mg/dL (ref 8–23)
CO2: 22 mmol/L (ref 22–32)
Calcium: 8.3 mg/dL — ABNORMAL LOW (ref 8.9–10.3)
Chloride: 107 mmol/L (ref 98–111)
Creatinine, Ser: 0.8 mg/dL (ref 0.44–1.00)
GFR, Estimated: >60 mL/min (ref >60)
Glucose, Bld: 169 mg/dL — ABNORMAL HIGH (ref 70–99)
Potassium: 4 mmol/L (ref 3.5–5.1)
Sodium: 137 mmol/L (ref 135–145)

## 2020-12-31 LAB — CBC
HCT: 43 % (ref 36.0–46.0)
Hemoglobin: 14.3 g/dL (ref 12.0–15.0)
MCH: 29.5 pg (ref 26.0–34.0)
MCHC: 33.3 g/dL (ref 30.0–36.0)
MCV: 88.8 fL (ref 80.0–100.0)
Platelets: 163 10*3/uL (ref 150–400)
RBC: 4.84 MIL/uL (ref 3.87–5.11)
RDW: 12.6 % (ref 11.5–15.5)
WBC: 10 10*3/uL (ref 4.0–10.5)
nRBC: 0 % (ref 0.0–0.2)

## 2020-12-31 LAB — HIV ANTIBODY (ROUTINE TESTING W REFLEX): HIV Screen 4th Generation wRfx: NONREACTIVE

## 2020-12-31 LAB — CBG MONITORING, ED: Glucose-Capillary: 191 mg/dL — ABNORMAL HIGH (ref 70–99)

## 2020-12-31 LAB — HEMOGLOBIN A1C
Hgb A1c MFr Bld: 5.9 % — ABNORMAL HIGH (ref 4.8–5.6)
Mean Plasma Glucose: 122.63 mg/dL

## 2020-12-31 LAB — LACTIC ACID, PLASMA: Lactic Acid, Venous: 1.5 mmol/L (ref 0.5–1.9)

## 2020-12-31 MED ORDER — SODIUM CHLORIDE 0.9 % IV SOLN
1.0000 g | INTRAVENOUS | Status: DC
  Administered 2020-12-31: 1 g via INTRAVENOUS
  Filled 2020-12-31: qty 10

## 2020-12-31 MED ORDER — CIPROFLOXACIN HCL 500 MG PO TABS: 500.0000 mg | ORAL_TABLET | Freq: Two times a day (BID) | ORAL | 0 refills | Status: AC

## 2020-12-31 NOTE — Discharge Summary (Signed)
Physician Discharge Summary  Lindsay Lopez YWV:[id number] DOB: [birth year] DOA: 12/30/2020  PCP: Floreen Comber, MD  Admit date: 12/30/2020 Discharge date: 12/31/2020  Admitted From: home  Disposition: Pt left AMA  Recommendations for Outpatient Follow-up:  1. Pt left AMA   Home Health: no  Equipment/Devices:  Discharge Condition: stable CODE STATUS: full  Diet recommendation: Heart Healthy / Carb Modified   Brief/Interim Summary: HPI was taken from Dr. Margette Fast: Patient is a poor historian due to her condition.  Unable to obtain any appropriate history from patient.  Patient primary contact, nephew/Travis was contacted and is equally a poor historian.  Lindsay Lopez is an 65 y.o. female with documented history of hypertension,CAD, diabetes mellitus on insulin and sulfonylureas.  Patient was brought in this evening to the ED by EMS after patient was reported to be altered in mental status from her baseline.  Patient was reported to be talking out of her head as per nephew Feliz Beam.  No reported loss of consciousness.  No reported falls.  When EMS arrived, patient was reported to be hypoglycemic with blood glucose of 59.  Dextrose was offered with improvement.  Patient was reported to have vomited after dextrose was given.  She became combative in the ED with no obvious focal deficit.  Currently doing better and sleeping.  She is arousable but not cooperative with history and physical examination.  Review of records suggest last admission was in 2019.  Her med rec includes Keppra.  Unclear if patient has history of seizure disorder.  No reported seizures.  Pharmacy to help reconcile home medications.  In the ED, CT head was unremarkable.  Chest x-ray was unremarkable.  Labs reviewed were unremarkable.  COVID test was also negative.   Pt decided to [facility] and was AA&Ox4. Pt was sent home w/ po cipro for likely UTI. Pt was told to f/u w/ her PCP as well as endocrinologist for poorly  controlled DM2. Pt verbalized her understanding. I advised against pt leaving AMA but pt was insistent on leaving the hospital today  & verbalized her understanding of the risks of her leaving AMA. For more information, please see previous progress notes.    Discharge Diagnoses:  Active Problems:   Encephalopathy acute  Acute metabolic encephalopathy: likely secondary to hypoglycemia and UTI. Hypoglycemia has been corrected & resolved. Continue on abxs. Back to baseline   UTI: UA is positive. Urine cx is pending. Continue on IV ceftriaxone   DM2: likely poorly controlled w/ hypoglycemic episodes. Continue to hold home dose of metformin, trulicity, glimepiride while. Continue on SSI w/ accuchecks  HTN: continue on home dose of enalapril, metoprolol   HLD: continue on statin   Hx of CAD: continue on home dose of metoprolol, enalapril, statin   Lactic acidosis: resolved   Discharge Instructions     Allergies  Allergen Reactions  . Propoxyphene Other (See Comments)    Sees little things    Consultations:     Procedures/Studies: CT Head Wo Contrast  Result Date: 12/30/2020 CLINICAL DATA:  Delirium EXAM: CT HEAD WITHOUT CONTRAST TECHNIQUE: Contiguous axial images were obtained from the base of the skull through the vertex without intravenous contrast. COMPARISON:  None. FINDINGS: Brain: There is no mass, hemorrhage or extra-axial collection. The size and configuration of the ventricles and extra-axial CSF spaces are normal. The brain parenchyma is normal, without acute or chronic infarction. Vascular: No abnormal hyperdensity of the major intracranial arteries or dural venous sinuses. No intracranial atherosclerosis. Skull: The  visualized skull base, calvarium and extracranial soft tissues are normal. Sinuses/Orbits: No fluid levels or advanced mucosal thickening of the visualized paranasal sinuses. No mastoid or middle ear effusion. The orbits are normal. IMPRESSION: Normal head  CT. Electronically Signed   By: Deatra RobinsonKevin  Herman M.D.   On: 12/30/2020 19:42   DG Chest Portable 1 View  Result Date: 12/30/2020 CLINICAL DATA:  Altered mental status EXAM: PORTABLE CHEST 1 VIEW COMPARISON:  09/05/2017 FINDINGS: Low lung volumes. No pleural effusion or pneumothorax. Normal cardiac size with aortic atherosclerosis. Mild diffuse interstitial opacity. IMPRESSION: 1. No focal airspace disease. 2. Mild diffuse interstitial opacity, question interstitial inflammatory process, less likely edema Electronically Signed   By: Jasmine PangKim  Fujinaga M.D.   On: 12/30/2020 20:00     Subjective: Pt denies any complaints currently & states she has to go home today.   Discharge Exam: Vitals:   12/31/20 0700 12/31/20 0900  BP: 134/65 (!) 179/78  Pulse: 79 80  Resp: 18 17  Temp:    SpO2: 97% 100%   Vitals:   12/31/20 0600 12/31/20 0630 12/31/20 0700 12/31/20 0900  BP: 127/66 116/66 134/65 (!) 179/78  Pulse: 89 83 79 80  Resp: 19 17 18 17   Temp:      TempSrc:      SpO2: 95% 93% 97% 100%  Weight:      Height:        General: Pt is alert, awake, not in acute distress Cardiovascular: S1/S2 +, no rubs, no gallops Respiratory: CTA bilaterally, no wheezing, no rhonchi Abdominal: Soft, NT, obese, bowel sounds + Extremities: no cyanosis    The results of significant diagnostics from this hospitalization (including imaging, microbiology, ancillary and laboratory) are listed below for reference.     Microbiology: Recent Results (from the past 240 hour(s))  Resp Panel by RT-PCR (Flu A&B, Covid) Nasopharyngeal Swab     Status: None   Collection Time: 12/30/20  8:49 PM   Specimen: Nasopharyngeal Swab; Nasopharyngeal(NP) swabs in vial transport medium  Result Value Ref Range Status   SARS Coronavirus 2 by RT PCR NEGATIVE NEGATIVE Final    Comment: (NOTE) SARS-CoV-2 target nucleic acids are NOT DETECTED.  The SARS-CoV-2 RNA is generally detectable in upper respiratory specimens during the  acute phase of infection. The lowest concentration of SARS-CoV-2 viral copies this assay can detect is 138 copies/mL. A negative result does not preclude SARS-Cov-2 infection and should not be used as the sole basis for treatment or other patient management decisions. A negative result may occur with  improper specimen collection/handling, submission of specimen other than nasopharyngeal swab, presence of viral mutation(s) within the areas targeted by this assay, and inadequate number of viral copies(<138 copies/mL). A negative result must be combined with clinical observations, patient history, and epidemiological information. The expected result is Negative.  Fact Sheet for Patients:  BloggerCourse.comhttps://www.fda.gov/media/152166/download  Fact Sheet for Healthcare Providers:  SeriousBroker.ithttps://www.fda.gov/media/152162/download  This test is no t yet approved or cleared by the [place] FDA and  has been authorized for detection and/or diagnosis of SARS-CoV-2 by FDA under an Emergency Use Authorization (EUA). This EUA will remain  in effect (meaning this test can be used) for the duration of the COVID-19 declaration under Section 564(b)(1) of the Act, 21 U.S.C.section 360bbb-3(b)(1), unless the authorization is terminated  or revoked sooner.       Influenza A by PCR NEGATIVE NEGATIVE Final   Influenza B by PCR NEGATIVE NEGATIVE Final    Comment: (NOTE) The Xpert  Xpress SARS-CoV-2/FLU/RSV plus assay is intended as an aid in the diagnosis of influenza from Nasopharyngeal swab specimens and should not be used as a sole basis for treatment. Nasal washings and aspirates are unacceptable for Xpert Xpress SARS-CoV-2/FLU/RSV testing.  Fact Sheet for Patients: BloggerCourse.com  Fact Sheet for Healthcare Providers: SeriousBroker.it  This test is not yet approved or cleared by the [place] FDA and has been authorized for detection and/or  diagnosis of SARS-CoV-2 by FDA under an Emergency Use Authorization (EUA). This EUA will remain in effect (meaning this test can be used) for the duration of the COVID-19 declaration under Section 564(b)(1) of the Act, 21 U.S.C. section 360bbb-3(b)(1), unless the authorization is terminated or revoked.  Performed at [facility], [address] [place], [place] [address]   CULTURE, BLOOD (ROUTINE X 2) w Reflex to ID Panel     Status: None (Preliminary result)   Collection Time: 12/31/20 12:10 AM   Specimen: BLOOD  Result Value Ref Range Status   Specimen Description BLOOD BLOOD RIGHT HAND  Final   Special Requests   Final    BOTTLES DRAWN AEROBIC AND ANAEROBIC Blood Culture adequate volume   Culture   Final    NO GROWTH < 12 HOURS Performed at [facility], [address] [place], [place] [address]    Report Status PENDING  Incomplete  CULTURE, BLOOD (ROUTINE X 2) w Reflex to ID Panel     Status: None (Preliminary result)   Collection Time: 12/31/20 12:10 AM   Specimen: BLOOD  Result Value Ref Range Status   Specimen Description BLOOD BLOOD LEFT HAND  Final   Special Requests   Final    BOTTLES DRAWN AEROBIC AND ANAEROBIC Blood Culture adequate volume   Culture   Final    NO GROWTH < 12 HOURS Performed at [facility], [address] [place], [place] [address]    Report Status PENDING  Incomplete     Labs: BNP (last 3 results) No results for input(s): BNP in the last 8760 hours. Basic Metabolic Panel: Recent Labs  Lab 12/30/20 1900 12/31/20 0433  NA 137 137  K 4.2 4.0  CL 103 107  CO2 23 22  GLUCOSE 119* 169*  BUN 15 14  CREATININE 0.90 0.80  CALCIUM 9.3 8.3*   Liver Function Tests: Recent Labs  Lab 12/30/20 1900  AST 32  ALT 26  ALKPHOS 89  BILITOT 1.3*  PROT 7.5  ALBUMIN 4.2   No results for input(s): LIPASE, AMYLASE in the last 168 hours. No results for input(s): AMMONIA in the last 168 hours. CBC: Recent Labs   Lab 12/30/20 1900 12/31/20 0510  WBC 8.1 10.0  NEUTROABS 4.9  --   HGB 15.3* 14.3  HCT 46.1* 43.0  MCV 88.7 88.8  PLT 188 163   Cardiac Enzymes: No results for input(s): CKTOTAL, CKMB, CKMBINDEX, TROPONINI in the last 168 hours. BNP: Invalid input(s): POCBNP CBG: Recent Labs  Lab 12/30/20 2048 12/31/20 0716  GLUCAP 100* 191*   D-Dimer No results for input(s): DDIMER in the last 72 hours. Hgb A1c Recent Labs    12/31/20 0001  HGBA1C 5.9*   Lipid Profile No results for input(s): CHOL, HDL, LDLCALC, TRIG, CHOLHDL, LDLDIRECT in the last 72 hours. Thyroid function studies No results for input(s): TSH, T4TOTAL, T3FREE, THYROIDAB in the last 72 hours.  Invalid input(s): FREET3 Anemia work up No results for input(s): VITAMINB12, FOLATE, FERRITIN, TIBC, IRON, RETICCTPCT in the last 72 hours.  Urinalysis    Component Value Date/Time   COLORURINE YELLOW (A) 12/30/2020 1900   APPEARANCEUR CLOUDY (A) 12/30/2020 1900   LABSPEC 1.025 12/30/2020 1900   PHURINE 5.0 12/30/2020 1900   GLUCOSEU NEGATIVE 12/30/2020 1900   HGBUR NEGATIVE 12/30/2020 1900   BILIRUBINUR NEGATIVE 12/30/2020 1900   KETONESUR NEGATIVE 12/30/2020 1900   PROTEINUR 100 (A) 12/30/2020 1900   NITRITE POSITIVE (A) 12/30/2020 1900   LEUKOCYTESUR SMALL (A) 12/30/2020 1900   Sepsis Labs Invalid input(s): PROCALCITONIN,  WBC,  LACTICIDVEN Microbiology Recent Results (from the past 240 hour(s))  Resp Panel by RT-PCR (Flu A&B, Covid) Nasopharyngeal Swab     Status: None   Collection Time: 12/30/20  8:49 PM   Specimen: Nasopharyngeal Swab; Nasopharyngeal(NP) swabs in vial transport medium  Result Value Ref Range Status   SARS Coronavirus 2 by RT PCR NEGATIVE NEGATIVE Final    Comment: (NOTE) SARS-CoV-2 target nucleic acids are NOT DETECTED.  The SARS-CoV-2 RNA is generally detectable in upper respiratory specimens during the acute phase of infection. The lowest concentration of SARS-CoV-2 viral copies this  assay can detect is 138 copies/mL. A negative result does not preclude SARS-Cov-2 infection and should not be used as the sole basis for treatment or other patient management decisions. A negative result may occur with  improper specimen collection/handling, submission of specimen other than nasopharyngeal swab, presence of viral mutation(s) within the areas targeted by this assay, and inadequate number of viral copies(<138 copies/mL). A negative result must be combined with clinical observations, patient history, and epidemiological information. The expected result is Negative.  Fact Sheet for Patients:  BloggerCourse.com  Fact Sheet for Healthcare Providers:  SeriousBroker.it  This test is no t yet approved or cleared by the [place] FDA and  has been authorized for detection and/or diagnosis of SARS-CoV-2 by FDA under an Emergency Use Authorization (EUA). This EUA will remain  in effect (meaning this test can be used) for the duration of the COVID-19 declaration under Section 564(b)(1) of the Act, 21 U.S.C.section 360bbb-3(b)(1), unless the authorization is terminated  or revoked sooner.       Influenza A by PCR NEGATIVE NEGATIVE Final   Influenza B by PCR NEGATIVE NEGATIVE Final    Comment: (NOTE) The Xpert Xpress SARS-CoV-2/FLU/RSV plus assay is intended as an aid in the diagnosis of influenza from Nasopharyngeal swab specimens and should not be used as a sole basis for treatment. Nasal washings and aspirates are unacceptable for Xpert Xpress SARS-CoV-2/FLU/RSV testing.  Fact Sheet for Patients: BloggerCourse.com  Fact Sheet for Healthcare Providers: SeriousBroker.it  This test is not yet approved or cleared by the [place] FDA and has been authorized for detection and/or diagnosis of SARS-CoV-2 by FDA under an Emergency Use Authorization (EUA). This EUA will  remain in effect (meaning this test can be used) for the duration of the COVID-19 declaration under Section 564(b)(1) of the Act, 21 U.S.C. section 360bbb-3(b)(1), unless the authorization is terminated or revoked.  Performed at [facility], [address] [place], [place] [address]   CULTURE, BLOOD (ROUTINE X 2) w Reflex to ID Panel     Status: None (Preliminary result)   Collection Time: 12/31/20 12:10 AM   Specimen: BLOOD  Result Value Ref Range Status   Specimen Description BLOOD BLOOD RIGHT HAND  Final   Special Requests   Final    BOTTLES DRAWN AEROBIC AND ANAEROBIC Blood Culture adequate volume   Culture   Final    NO GROWTH <  12 HOURS Performed at [facility], [address] [place], [place] [address]    Report Status PENDING  Incomplete  CULTURE, BLOOD (ROUTINE X 2) w Reflex to ID Panel     Status: None (Preliminary result)   Collection Time: 12/31/20 12:10 AM   Specimen: BLOOD  Result Value Ref Range Status   Specimen Description BLOOD BLOOD LEFT HAND  Final   Special Requests   Final    BOTTLES DRAWN AEROBIC AND ANAEROBIC Blood Culture adequate volume   Culture   Final    NO GROWTH < 12 HOURS Performed at [facility], [address] [place], [place] [address]    Report Status PENDING  Incomplete     Time coordinating discharge: Over 30 minutes  SIGNED:   Charise Killian, MD  [facility] 12/31/2020, 4:55 PM Pager   If 7PM-7AM, please contact night-coverage

## 2020-12-31 NOTE — ED Notes (Signed)
Dr. Margette Fast notified UA results are back

## 2020-12-31 NOTE — ED Notes (Addendum)
Dr. Erma Heritage notified patient is confused, unable to follow commands to remain still during CT scan. Awaiting orders.

## 2020-12-31 NOTE — ED Notes (Signed)
Patient found standing at the foot of the bed, states "where am I, who brought me here". Reoriented and assisted back to bed at this time. Patient following commands, answering questions appropriately. Calm and cooperative.

## 2020-12-31 NOTE — ED Notes (Signed)
Patient transported to CT by this RN on cardiac monitor. 

## 2020-12-31 NOTE — ED Notes (Signed)
Patient confused, pulling off medical equipment. Unable to follow commands. Patient was assisted back into bed and placed on cardiac monitor. Vomiting. MD at bedside.

## 2021-01-02 LAB — URINE CULTURE: Culture: >=100000 — AB

## 2021-01-03 NOTE — Progress Notes (Signed)
Urine cx grew e. coli which was resistant to cipro. A script for bactrim was called into pt's pharmacy, [facility]. Pt was called and notified of change in abxs. Pt verbalized her understanding  This is non-billable note

## 2021-01-05 LAB — CULTURE, BLOOD (ROUTINE X 2)
Culture: NO GROWTH
Culture: NO GROWTH
Special Requests: ADEQUATE
Special Requests: ADEQUATE

## 2021-03-13 ENCOUNTER — Other Ambulatory Visit: Payer: Self-pay

## 2021-03-13 ENCOUNTER — Emergency Department
Admission: EM | Admit: 2021-03-13 | Discharge: 2021-03-13 | Disposition: A | Discharge status: Home / Self Care | Payer: Medicare HMO | Attending: Emergency Medicine | Admitting: Emergency Medicine

## 2021-03-13 DIAGNOSIS — I10 Essential (primary) hypertension: Secondary | ICD-10-CM | POA: Diagnosis not present

## 2021-03-13 DIAGNOSIS — E162 Hypoglycemia, unspecified: Secondary | ICD-10-CM | POA: Insufficient documentation

## 2021-03-13 DIAGNOSIS — Z7984 Long term (current) use of oral hypoglycemic drugs: Secondary | ICD-10-CM | POA: Insufficient documentation

## 2021-03-13 DIAGNOSIS — F17210 Nicotine dependence, cigarettes, uncomplicated: Secondary | ICD-10-CM | POA: Diagnosis not present

## 2021-03-13 DIAGNOSIS — Z79899 Other long term (current) drug therapy: Secondary | ICD-10-CM | POA: Diagnosis not present

## 2021-03-13 DIAGNOSIS — Z794 Long term (current) use of insulin: Secondary | ICD-10-CM | POA: Diagnosis not present

## 2021-03-13 LAB — URINALYSIS, COMPLETE (UACMP) WITH MICROSCOPIC
Bacteria, UA: NONE SEEN
Bilirubin Urine: NEGATIVE
Glucose, UA: NEGATIVE mg/dL
Hgb urine dipstick: NEGATIVE
Ketones, ur: NEGATIVE mg/dL
Leukocytes,Ua: NEGATIVE
Nitrite: NEGATIVE
Protein, ur: >=300 mg/dL — AB
Specific Gravity, Urine: 1.027 (ref 1.005–1.030)
pH: 5 (ref 5.0–8.0)

## 2021-03-13 LAB — BASIC METABOLIC PANEL
Anion gap: 8 (ref 5–15)
BUN: 14 mg/dL (ref 8–23)
CO2: 26 mmol/L (ref 22–32)
Calcium: 9.1 mg/dL (ref 8.9–10.3)
Chloride: 104 mmol/L (ref 98–111)
Creatinine, Ser: 0.74 mg/dL (ref 0.44–1.00)
GFR, Estimated: >60 mL/min (ref >60)
Glucose, Bld: 132 mg/dL — ABNORMAL HIGH (ref 70–99)
Potassium: 3.9 mmol/L (ref 3.5–5.1)
Sodium: 138 mmol/L (ref 135–145)

## 2021-03-13 LAB — CBC WITH DIFFERENTIAL/PLATELET
Abs Immature Granulocytes: 0.03 10*3/uL (ref 0.00–0.07)
Basophils Absolute: 0 10*3/uL (ref 0.0–0.1)
Basophils Relative: 0 %
Eosinophils Absolute: 0 10*3/uL (ref 0.0–0.5)
Eosinophils Relative: 0 %
HCT: 41.5 % (ref 36.0–46.0)
Hemoglobin: 13.9 g/dL (ref 12.0–15.0)
Immature Granulocytes: 1 %
Lymphocytes Relative: 33 %
Lymphs Abs: 2 10*3/uL (ref 0.7–4.0)
MCH: 30.2 pg (ref 26.0–34.0)
MCHC: 33.5 g/dL (ref 30.0–36.0)
MCV: 90.2 fL (ref 80.0–100.0)
Monocytes Absolute: 0.4 10*3/uL (ref 0.1–1.0)
Monocytes Relative: 7 %
Neutro Abs: 3.5 10*3/uL (ref 1.7–7.7)
Neutrophils Relative %: 59 %
Platelets: 204 10*3/uL (ref 150–400)
RBC: 4.6 MIL/uL (ref 3.87–5.11)
RDW: 13.1 % (ref 11.5–15.5)
WBC: 5.9 10*3/uL (ref 4.0–10.5)
nRBC: 0 % (ref 0.0–0.2)

## 2021-03-13 LAB — CBG MONITORING, ED: Glucose-Capillary: 119 mg/dL — ABNORMAL HIGH (ref 70–99)

## 2021-03-13 NOTE — ED Triage Notes (Signed)
Pt via EMS from home. EMS was called out for AMS, pt was combative on EMS arrival they gave 4 of Versed. Pt's initial CBG was 54. EMS gave 25g of D10. Repeat CBG was 164. Pt is A&Ox4 and NAD at this time.

## 2021-03-13 NOTE — ED Provider Notes (Signed)
[facility] Emergency Department Provider Note ____________________________________________   Event Date/Time   First MD Initiated Contact with Patient 03/13/21 1205     (approximate)  I have reviewed the triage vital signs and the nursing notes.   HISTORY  Chief Complaint Hypoglycemia  HPI Lindsay Lopez is a 65 y.o. female with history of Type II DM presents to the emergency department for treatment and evaluation of hypoglycemia. She states that she may have accidentally taken glipizide with her morning medications. She states that her nephew called EMS even though she didn't want him to. Per EMS report, upon their arrival patient was combative with glucose of 54 and 25g of D10 given. Versed 4mg  was given IM due to combative behavior.         Past Medical History:  Diagnosis Date   Hypertension    MI (myocardial infarction) (HCC)    2010    Patient Active Problem List   Diagnosis Date Noted   Encephalopathy acute 12/30/2020   ARF (acute renal failure) (HCC) 09/05/2017    No past surgical history on file.  Prior to Admission medications   Medication Sig Start Date End Date Taking? Authorizing Provider  acetaminophen (TYLENOL) 500 MG tablet Take 500 mg by mouth every 6 (six) hours as needed.    [provider]  albuterol (VENTOLIN HFA) 108 (90 Base) MCG/ACT inhaler Inhale 2 puffs into the lungs every 6 (six) hours as needed for wheezing. 05/08/20 05/08/21  [provider]  atorvastatin (LIPITOR) 80 MG tablet Take 80 mg by mouth daily. 06/01/20 06/01/21  [provider]  Dulaglutide (TRULICITY) 3 MG/0.5ML SOPN Inject 3 mg into the skin every 7 (seven) days. 06/01/20   [provider]  enalapril (VASOTEC) 20 MG tablet Take 1 tablet (20 mg total) by mouth daily. Patient taking differently: Take 20 mg by mouth 2 (two) times daily. 09/06/17   09/08/17, MD  hydrochlorothiazide (MICROZIDE) 12.5 MG capsule Take 1  capsule (12.5 mg total) by mouth daily. 09/11/17   09/13/17, MD  insulin aspart (NOVOLOG) 100 UNIT/ML injection Inject 24 Units into the skin 2 (two) times daily.    [provider]  insulin glargine (LANTUS) 100 UNIT/ML injection Inject 50 Units into the skin at bedtime.    [provider]  levETIRAcetam (KEPPRA) 750 MG tablet Take 750 mg by mouth 2 (two) times daily.    [provider]  levocetirizine (XYZAL) 5 MG tablet Take 5 mg by mouth every evening.    [provider]  methocarbamol (ROBAXIN) 500 MG tablet Take 1,000 mg by mouth 2 (two) times daily.    [provider]  metoprolol tartrate (LOPRESSOR) 100 MG tablet Take 100 mg by mouth 2 (two) times daily.    [provider]  PARoxetine (PAXIL) 20 MG tablet Take 20 mg by mouth daily.    [provider]    Allergies Propoxyphene  No family history on file.  Social History Social History   Tobacco Use   Smoking status: Every Day    Packs/day: 0.50    Types: Cigarettes   Smokeless tobacco: Never  Vaping Use   Vaping Use: Never used  Substance Use Topics   Alcohol use: No   Drug use: No    Review of Systems  Constitutional: No fever/chills Eyes: No visual changes. ENT: No sore throat. Cardiovascular: Denies chest pain. Respiratory: Denies shortness of breath. Gastrointestinal: No abdominal pain.  No nausea, no vomiting.  No diarrhea.  No constipation. Genitourinary: Negative for dysuria. Musculoskeletal: Negative for back pain. Skin: Negative for rash. Neurological: Negative for headaches, focal weakness or numbness. ____________________________________________   PHYSICAL EXAM:  VITAL SIGNS: Today's Vitals   03/13/21 1219 03/13/21 1220  BP: (!) 174/89   Pulse: 61   Resp: 20   Temp: 98.2 F (36.8 C)   TempSrc: Oral   SpO2: 99%   PainSc:  0-No pain   There is no height or weight on file to calculate BMI.   Constitutional: Alert and oriented.  Well appearing and in no acute distress. Eyes: Conjunctivae are normal. Head: Atraumatic. Nose: No congestion/rhinnorhea. Mouth/Throat: Mucous membranes are moist.  Oropharynx non-erythematous. Neck: No stridor.   Hematological/Lymphatic/Immunilogical: No cervical lymphadenopathy. Cardiovascular: Normal rate, regular rhythm. Grossly normal heart sounds.  Good peripheral circulation. Respiratory: Normal respiratory effort.  No retractions. Lungs CTAB. Gastrointestinal: Soft and nontender. No distention. Genitourinary:  Musculoskeletal: No lower extremity tenderness nor edema.  No joint effusions. Neurologic:  Normal speech and language. No gross focal neurologic deficits are appreciated. No gait instability. Skin:  Skin is warm, dry and intact. No rash noted. Psychiatric: Mood and affect are normal. Speech and behavior are normal.  ____________________________________________   LABS (all labs ordered are listed, but only abnormal results are displayed)  Labs Reviewed  BASIC METABOLIC PANEL - Abnormal; Notable for the following components:      Result Value   Glucose, Bld 132 (*)    All other components within normal limits  URINALYSIS, COMPLETE (UACMP) WITH MICROSCOPIC - Abnormal; Notable for the following components:   Color, Urine YELLOW (*)    APPearance CLEAR (*)    Protein, ur >=300 (*)    All other components within normal limits  CBG MONITORING, ED - Abnormal; Notable for the following components:   Glucose-Capillary 119 (*)    All other components within normal limits  CBC WITH DIFFERENTIAL/PLATELET   ____________________________________________  EKG  Not indicated. ____________________________________________  RADIOLOGY  ED MD interpretation:    Not indicated. I, Kem Boroughs, personally viewed and evaluated these images (plain radiographs) as part of my medical decision making, as well as reviewing the written report by the radiologist.  Official radiology  report(s): No results found.  ____________________________________________   PROCEDURES  Procedure(s) performed (including Critical Care):  Procedures  ____________________________________________   INITIAL IMPRESSION / ASSESSMENT AND PLAN     65 year old female presenting to the emergency department after incident of hypoglycemia.  See HPI for further details.  On my exam, the patient is alert and oriented.  She states that she is fine and just needs to go home.  She did give permission for medical screening exam, which is overall reassuring.  DIFFERENTIAL DIAGNOSIS  Hypoglycemia, TIA, encephalopathy  ED COURSE  Upon chart review, it appears that she signed out AMA on 18 May of this year after similar event.  During that time, she did have a UTI that was treated with ciprofloxacin.  Urine culture shows that the specimen was was resistant to Cipro.  She believes that a second antibiotic was called in but is unsure if she completed that.  She has agreed to provide a specimen today just to ensure that she is cleared.  She continues to state that she is "ready to go home."  She agrees to stay until the urinalysis has resulted.  Labs and urinalysis reassuring. Patient sitting on side of bed talking with a visitor. She is requesting discharge. She will be advised to  avoid taking the glipizide if in fact she was advised to stop it. She was encouraged to schedule a follow up with her primary care.    ___________________________________________   FINAL CLINICAL IMPRESSION(S) / ED DIAGNOSES  Final diagnoses:  Hypoglycemia     ED Discharge Orders     None        Lindsay Lopez was evaluated in Emergency Department on 03/13/2021 for the symptoms described in the history of present illness. She was evaluated in the context of the global COVID-19 pandemic, which necessitated consideration that the patient might be at risk for infection with the SARS-CoV-2 virus that causes  COVID-19. Institutional protocols and algorithms that pertain to the evaluation of patients at risk for COVID-19 are in a state of rapid change based on information released by regulatory bodies including the CDC and federal and state organizations. These policies and algorithms were followed during the patient's care in the ED.   Note:  This document was prepared using Dragon voice recognition software and may include unintentional dictation errors.    Chinita Pester, FNP 03/13/21 1348    Concha Se, MD 03/13/21 [phone]

## 2021-03-13 NOTE — Discharge Instructions (Addendum)
Please call and schedule follow-up appointment with your doctor.  Return to the emergency department for any symptom of concern if you are unable to see primary care.

## 2021-05-19 ENCOUNTER — Emergency Department
Admission: EM | Admit: 2021-05-19 | Discharge: 2021-05-19 | Disposition: A | Discharge status: Home / Self Care | Payer: Medicare HMO | Attending: Emergency Medicine | Admitting: Emergency Medicine

## 2021-05-19 ENCOUNTER — Other Ambulatory Visit: Payer: Self-pay

## 2021-05-19 ENCOUNTER — Encounter: Payer: Self-pay | Admitting: Emergency Medicine

## 2021-05-19 DIAGNOSIS — I10 Essential (primary) hypertension: Secondary | ICD-10-CM | POA: Diagnosis not present

## 2021-05-19 DIAGNOSIS — F17210 Nicotine dependence, cigarettes, uncomplicated: Secondary | ICD-10-CM | POA: Insufficient documentation

## 2021-05-19 DIAGNOSIS — H1032 Unspecified acute conjunctivitis, left eye: Secondary | ICD-10-CM | POA: Diagnosis not present

## 2021-05-19 DIAGNOSIS — H109 Unspecified conjunctivitis: Secondary | ICD-10-CM

## 2021-05-19 DIAGNOSIS — X58XXXA Exposure to other specified factors, initial encounter: Secondary | ICD-10-CM | POA: Diagnosis not present

## 2021-05-19 DIAGNOSIS — Z794 Long term (current) use of insulin: Secondary | ICD-10-CM | POA: Diagnosis not present

## 2021-05-19 DIAGNOSIS — S0502XA Injury of conjunctiva and corneal abrasion without foreign body, left eye, initial encounter: Secondary | ICD-10-CM | POA: Diagnosis present

## 2021-05-19 DIAGNOSIS — Z79899 Other long term (current) drug therapy: Secondary | ICD-10-CM | POA: Insufficient documentation

## 2021-05-19 MED ORDER — POLYMYXIN B-TRIMETHOPRIM 10000-0.1 UNIT/ML-% OP SOLN: 1.0000 [drp] | OPHTHALMIC | 0 refills | Status: AC

## 2021-05-19 MED ORDER — TETRACAINE HCL 0.5 % OP SOLN
2.0000 [drp] | Freq: Once | OPHTHALMIC | Status: AC
  Administered 2021-05-19: 2 [drp] via OPHTHALMIC
  Filled 2021-05-19: qty 4

## 2021-05-19 MED ORDER — FLUORESCEIN SODIUM 1 MG OP STRP
1.0000 | ORAL_STRIP | Freq: Once | OPHTHALMIC | Status: AC
  Administered 2021-05-19: 1 via OPHTHALMIC
  Filled 2021-05-19: qty 1

## 2021-05-19 NOTE — ED Triage Notes (Signed)
Pt comes into the ED via POV c/o left eye pain and redness x 2 days.  Pt denies getting anything in the eye that she is aware of.  Redness to the sclera and conjunctiva present.

## 2021-05-19 NOTE — ED Provider Notes (Signed)
[facility] Emergency Department Provider Note   ____________________________________________   Event Date/Time   First MD Initiated Contact with Patient 05/19/21 1302     (approximate)  I have reviewed the triage vital signs and the nursing notes.   HISTORY  Chief Complaint Eye Pain    HPI Lindsay Lopez is a 65 y.o. female who presents for left eye pain  LOCATION: Left eye DURATION: 2 days prior to arrival TIMING: Worsening since onset SEVERITY: 6/10 QUALITY: Burning pain CONTEXT: Patient states she began noticing redness and burning pain to the left eye 2 days prior to arrival and has been worsening since onset MODIFYING FACTORS: Denies any exacerbating or relieving factors ASSOCIATED SYMPTOMS: Increased tear production for the left eye.  Denies any other family members with similar symptoms or spreading to the right eye   Per medical record review, patient has history of hypertension          Past Medical History:  Diagnosis Date   Hypertension    MI (myocardial infarction) (HCC)    2010    Patient Active Problem List   Diagnosis Date Noted   Encephalopathy acute 12/30/2020   ARF (acute renal failure) (HCC) 09/05/2017    History reviewed. No pertinent surgical history.  Prior to Admission medications   Medication Sig Start Date End Date Taking? Authorizing Provider  trimethoprim-polymyxin b (POLYTRIM) ophthalmic solution Place 1 drop into the left eye every 4 (four) hours. Until symptoms resolve 05/19/21  Yes Kimberl Vig, Clent Jacks, MD  acetaminophen (TYLENOL) 500 MG tablet Take 500 mg by mouth every 6 (six) hours as needed.    [provider]  albuterol (VENTOLIN HFA) 108 (90 Base) MCG/ACT inhaler Inhale 2 puffs into the lungs every 6 (six) hours as needed for wheezing. 05/08/20 05/08/21  [provider]  atorvastatin (LIPITOR) 80 MG tablet Take 80 mg by mouth daily. 06/01/20 06/01/21  [provider]   Dulaglutide (TRULICITY) 3 MG/0.5ML SOPN Inject 3 mg into the skin every 7 (seven) days. 06/01/20   [provider]  enalapril (VASOTEC) 20 MG tablet Take 1 tablet (20 mg total) by mouth daily. Patient taking differently: Take 20 mg by mouth 2 (two) times daily. 09/06/17   Enedina Finner, MD  hydrochlorothiazide (MICROZIDE) 12.5 MG capsule Take 1 capsule (12.5 mg total) by mouth daily. 09/11/17   Enedina Finner, MD  insulin aspart (NOVOLOG) 100 UNIT/ML injection Inject 24 Units into the skin 2 (two) times daily.    [provider]  insulin glargine (LANTUS) 100 UNIT/ML injection Inject 50 Units into the skin at bedtime.    [provider]  levETIRAcetam (KEPPRA) 750 MG tablet Take 750 mg by mouth 2 (two) times daily.    [provider]  levocetirizine (XYZAL) 5 MG tablet Take 5 mg by mouth every evening.    [provider]  methocarbamol (ROBAXIN) 500 MG tablet Take 1,000 mg by mouth 2 (two) times daily.    [provider]  metoprolol tartrate (LOPRESSOR) 100 MG tablet Take 100 mg by mouth 2 (two) times daily.    [provider]  PARoxetine (PAXIL) 20 MG tablet Take 20 mg by mouth daily.    [provider]    Allergies Propoxyphene  History reviewed. No pertinent family history.  Social History Social History   Tobacco Use   Smoking status: Every Day    Packs/day: 0.50    Types: Cigarettes   Smokeless tobacco: Never  Vaping Use  Vaping Use: Never used  Substance Use Topics   Alcohol use: No   Drug use: No    Review of Systems Constitutional: No fever/chills Eyes: Endorses pain and redness to the left eye.  No visual changes. ENT: No sore throat. Cardiovascular: Denies chest pain. Respiratory: Denies shortness of breath. Gastrointestinal: No abdominal pain.  No nausea, no vomiting.  No diarrhea. Genitourinary: Negative for dysuria. Musculoskeletal: Negative for acute arthralgias Skin: Negative for  rash. Neurological: Negative for headaches, weakness/numbness/paresthesias in any extremity Psychiatric: Negative for suicidal ideation/homicidal ideation   ____________________________________________   PHYSICAL EXAM:  VITAL SIGNS: ED Triage Vitals  Enc Vitals Group     BP 05/19/21 1044 (!) 165/64     Pulse Rate 05/19/21 1044 68     Resp 05/19/21 1044 16     Temp 05/19/21 1044 98 F (36.7 C)     Temp Source 05/19/21 1044 Oral     SpO2 05/19/21 1044 96 %     Weight 05/19/21 1044 230 lb (104.3 kg)     Height 05/19/21 1044 5\' 6"  (1.676 m)     Head Circumference --      Peak Flow --      Pain Score 05/19/21 1053 6     Pain Loc --      Pain Edu? --      Excl. in GC? --    Constitutional: Alert and oriented. Well appearing and in no acute distress. Eyes: Woods lamp examination of the left eye shows a small corneal abrasion as well as chemosis to the left eye.  Conjunctiva injected in the left eye. PERRL.  Right eye shows no evidence of abnormalities.  Visual acuity intact bilaterally Head: Atraumatic. Nose: No congestion/rhinnorhea. Mouth/Throat: Mucous membranes are moist. Neck: No stridor Cardiovascular: Grossly normal heart sounds.  Good peripheral circulation. Respiratory: Normal respiratory effort.  No retractions. Gastrointestinal: Soft and nontender. No distention. Musculoskeletal: No obvious deformities Neurologic:  Normal speech and language. No gross focal neurologic deficits are appreciated. Skin:  Skin is warm and dry. No rash noted. Psychiatric: Mood and affect are normal. Speech and behavior are normal.  ____________________________________________   LABS (all labs ordered are listed, but only abnormal results are displayed)  Labs Reviewed - No data to display ____________________________________________  PROCEDURES  Procedure(s) performed (including Critical Care):  Procedures   ____________________________________________   INITIAL IMPRESSION /  ASSESSMENT AND PLAN / ED COURSE  As part of my medical decision making, I reviewed the following data within the electronic medical record, if available:  Nursing notes reviewed and incorporated, Labs reviewed, EKG interpreted, Old chart reviewed, Radiograph reviewed and Notes from prior ED visits reviewed and incorporated        65 year old female with history and exam consistent with corneal abrasion of the left eye.  Initial considerations in this patient included corneal abrasion, intraocular and corneal foreign bodies, corneal ulceration, various etiologies of iritis, and various etiologies of conjunctivitis amongst others.   Patient presented with eye pain and redness with associated [photophobia, foreign body sensation, and decreased visual acuity] in the setting of recent [describe injury] suggestive of corneal abrasion.  Patient noted to have corneal abrasion on fluorescein examination with wood's lamp of the eye.  No evidence of foreign bodies with eversion of the eyelid.  Patient denies contact lens use, and has no other findings suggestive of corneal ulceration at this time.  No evidence of a positive Seidel test or other findings suggestive of globe perforation on evaluation in  the ED.  No evidence of corneal foreign body on exam.   Significant improvement in pain and visual acuity noted with application of topical anesthetic to the eye.  Prior to discharge, we discussed return precautions, treatment with lubricating eye drops and NSAIDs, and follow up with primary care doctor within 1 week as needed for further evaluation, and the patient demonstrated understanding and agreement.      ____________________________________________   FINAL CLINICAL IMPRESSION(S) / ED DIAGNOSES  Final diagnoses:  Abrasion of left cornea, initial encounter  Conjunctivitis of left eye, unspecified conjunctivitis type     ED Discharge Orders          Ordered    trimethoprim-polymyxin b  (POLYTRIM) ophthalmic solution  Every 4 hours        05/19/21 1325             Note:  This document was prepared using Dragon voice recognition software and may include unintentional dictation errors.    Merwyn Katos, MD 05/19/21 [phone]

## 2021-10-01 ENCOUNTER — Other Ambulatory Visit: Payer: Self-pay

## 2021-10-01 ENCOUNTER — Encounter: Payer: Self-pay | Admitting: Emergency Medicine

## 2021-10-01 ENCOUNTER — Emergency Department
Admission: EM | Admit: 2021-10-01 | Discharge: 2021-10-01 | Disposition: A | Discharge status: Home / Self Care | Payer: Medicare Other | Attending: Emergency Medicine | Admitting: Emergency Medicine

## 2021-10-01 ENCOUNTER — Emergency Department: Payer: Medicare Other

## 2021-10-01 DIAGNOSIS — R7309 Other abnormal glucose: Secondary | ICD-10-CM | POA: Diagnosis not present

## 2021-10-01 DIAGNOSIS — R8289 Other abnormal findings on cytological and histological examination of urine: Secondary | ICD-10-CM | POA: Insufficient documentation

## 2021-10-01 DIAGNOSIS — K1120 Sialoadenitis, unspecified: Secondary | ICD-10-CM | POA: Insufficient documentation

## 2021-10-01 DIAGNOSIS — I10 Essential (primary) hypertension: Secondary | ICD-10-CM | POA: Insufficient documentation

## 2021-10-01 DIAGNOSIS — R944 Abnormal results of kidney function studies: Secondary | ICD-10-CM | POA: Insufficient documentation

## 2021-10-01 DIAGNOSIS — R220 Localized swelling, mass and lump, head: Secondary | ICD-10-CM | POA: Diagnosis present

## 2021-10-01 LAB — URINALYSIS, ROUTINE W REFLEX MICROSCOPIC
Bacteria, UA: NONE SEEN
Bilirubin Urine: NEGATIVE
Glucose, UA: NEGATIVE mg/dL
Hgb urine dipstick: NEGATIVE
Ketones, ur: NEGATIVE mg/dL
Leukocytes,Ua: NEGATIVE
Nitrite: NEGATIVE
Protein, ur: 100 mg/dL — AB
Specific Gravity, Urine: 1.015 (ref 1.005–1.030)
pH: 5 (ref 5.0–8.0)

## 2021-10-01 LAB — COMPREHENSIVE METABOLIC PANEL
ALT: 17 U/L (ref 0–44)
AST: 21 U/L (ref 15–41)
Albumin: 3.4 g/dL — ABNORMAL LOW (ref 3.5–5.0)
Alkaline Phosphatase: 68 U/L (ref 38–126)
Anion gap: 7 (ref 5–15)
BUN: 20 mg/dL (ref 8–23)
CO2: 24 mmol/L (ref 22–32)
Calcium: 8.8 mg/dL — ABNORMAL LOW (ref 8.9–10.3)
Chloride: 108 mmol/L (ref 98–111)
Creatinine, Ser: 1.14 mg/dL — ABNORMAL HIGH (ref 0.44–1.00)
GFR, Estimated: 53 mL/min — ABNORMAL LOW (ref >60)
Glucose, Bld: 239 mg/dL — ABNORMAL HIGH (ref 70–99)
Potassium: 4.4 mmol/L (ref 3.5–5.1)
Sodium: 139 mmol/L (ref 135–145)
Total Bilirubin: 1 mg/dL (ref 0.3–1.2)
Total Protein: 6.5 g/dL (ref 6.5–8.1)

## 2021-10-01 LAB — CBC WITH DIFFERENTIAL/PLATELET
Abs Immature Granulocytes: 0.03 10*3/uL (ref 0.00–0.07)
Basophils Absolute: 0 10*3/uL (ref 0.0–0.1)
Basophils Relative: 0 %
Eosinophils Absolute: 0 10*3/uL (ref 0.0–0.5)
Eosinophils Relative: 0 %
HCT: 38.2 % (ref 36.0–46.0)
Hemoglobin: 12.5 g/dL (ref 12.0–15.0)
Immature Granulocytes: 0 %
Lymphocytes Relative: 26 %
Lymphs Abs: 1.9 10*3/uL (ref 0.7–4.0)
MCH: 28.7 pg (ref 26.0–34.0)
MCHC: 32.7 g/dL (ref 30.0–36.0)
MCV: 87.8 fL (ref 80.0–100.0)
Monocytes Absolute: 0.7 10*3/uL (ref 0.1–1.0)
Monocytes Relative: 10 %
Neutro Abs: 4.4 10*3/uL (ref 1.7–7.7)
Neutrophils Relative %: 64 %
Platelets: 175 10*3/uL (ref 150–400)
RBC: 4.35 MIL/uL (ref 3.87–5.11)
RDW: 12.9 % (ref 11.5–15.5)
WBC: 7 10*3/uL (ref 4.0–10.5)
nRBC: 0 % (ref 0.0–0.2)

## 2021-10-01 MED ORDER — IOHEXOL 350 MG/ML SOLN
65.0000 mL | Freq: Once | INTRAVENOUS | Status: AC | PRN
  Administered 2021-10-01: 65 mL via INTRAVENOUS
  Filled 2021-10-01: qty 65

## 2021-10-01 MED ORDER — DICLOXACILLIN SODIUM 250 MG PO CAPS: 500.0000 mg | ORAL_CAPSULE | Freq: Four times a day (QID) | ORAL | 0 refills | Status: AC

## 2021-10-01 NOTE — Discharge Instructions (Addendum)
She can take Tylenol 1 g every 8 hours to help with pain and start the antibiotics to help with any infection.  Use lemon drops.Avoid benadryl.   IMPRESSION: Right submandibular gland sialadenitis with trans-spatial inflammation as above. No salivary stone or mass.

## 2021-10-01 NOTE — ED Triage Notes (Signed)
Pt in with co facial swelling states started yesterday and has worsened since. Pt had teeth removed a few weeks ago and started using new dentures last week which she says did irritate her.

## 2021-10-01 NOTE — ED Notes (Signed)
1 set of cultures and a lactic acid sample sent to lab.

## 2021-10-01 NOTE — ED Provider Notes (Signed)
[name] Provider Note    Event Date/Time   First MD Initiated Contact with Patient 10/01/21 1040     (approximate)   History   Facial Swelling   HPI  Lindsay Lopez is a 66 y.o. female   with HTN,, MI who comes in with face swelling.  Patient reports having some teeth pulled a long time ago but then about a week she got some dentures.  She felt on the right side that it was rubbing up against her gums and then last night she started noticed some swelling in the underneath her neck and into the right side of her face.  She denies ever having this previously.  Does report a lot of tenderness as well.  No issues swallowing secretions.  Physical Exam   Triage Vital Signs: ED Triage Vitals  Enc Vitals Group     BP 10/01/21 1036 (!) 151/76     Pulse Rate 10/01/21 1036 75     Resp 10/01/21 1036 20     Temp 10/01/21 1036 99.4 F (37.4 C)     Temp Source 10/01/21 1036 Oral     SpO2 10/01/21 1036 99 %     Weight 10/01/21 1037 235 lb (106.6 kg)     Height 10/01/21 1037 5\' 8"  (1.727 m)     Head Circumference --      Peak Flow --      Pain Score 10/01/21 1037 8     Pain Loc --      Pain Edu? --      Excl. in GC? --     Most recent vital signs: Vitals:   10/01/21 1036  BP: (!) 151/76  Pulse: 75  Resp: 20  Temp: 99.4 F (37.4 C)  SpO2: 99%     General: Awake, no distress.  CV:  Good peripheral perfusion.  Resp:  Normal effort.  Abd:  No distention.  Other:  Fullness and swelling noted underneath the right mandible.  Patient has had multiple teeth removed on the bottom mandible but no evidence of abscess.  Underneath her tongue there is no swelling and is soft.  ED Results / Procedures / Treatments   Labs (all labs ordered are listed, but only abnormal results are displayed) Labs Reviewed  COMPREHENSIVE METABOLIC PANEL - Abnormal; Notable for the following components:      Result Value   Glucose, Bld 239 (*)    Creatinine, Ser 1.14 (*)     Calcium 8.8 (*)    Albumin 3.4 (*)    GFR, Estimated 53 (*)    All other components within normal limits  URINALYSIS, ROUTINE W REFLEX MICROSCOPIC - Abnormal; Notable for the following components:   Color, Urine YELLOW (*)    APPearance CLEAR (*)    Protein, ur 100 (*)    All other components within normal limits  CBC WITH DIFFERENTIAL/PLATELET      RADIOLOGY I have reviewed the CT personally and appears there is inflammation of the right submandibular gland  IMPRESSION: Right submandibular gland sialadenitis with trans-spatial inflammation as above. No salivary stone or mass.      PROCEDURES:  Critical Care performed: No  Procedures   MEDICATIONS ORDERED IN ED: Medications  iohexol (OMNIPAQUE) 350 MG/ML injection 65 mL (65 mLs Intravenous Contrast Given 10/01/21 1159)     IMPRESSION / MDM / ASSESSMENT AND PLAN / ED COURSE  I reviewed the triage vital signs and the nursing notes.  Differential diagnosis includes, but is not limited to, abscess, parotitis, Ludwig's, cellulitis, sialedenitis.  Will get CT imaging to further evaluate  UA without evidence of UTI.  Some protein in her urine similar to prior CBC is normal with normal white count CMP shows slightly elevated glucose but no evidence of DKA.  Kidney function is slightly elevated patient is tolerating p.o.  Discussed with patient's CT results.  Recommended lemon drops, avoiding things like Benadryl.  Recommended Tylenol, antibiotics to help with pain.  Patient expressed understanding felt comfortable with discharge home and will follow-up with ENT if symptoms are worsening or not getting better  I discussed the provisional nature of ED diagnosis, the treatment so far, the ongoing plan of care, follow up appointments and return precautions with the patient and any family or support people present. They expressed understanding and agreed with the plan, discharged  home.       FINAL CLINICAL IMPRESSION(S) / ED DIAGNOSES   Final diagnoses:  Sialoadenitis of submandibular gland     Rx / DC Orders   ED Discharge Orders          Ordered    dicloxacillin (DYNAPEN) 250 MG capsule  4 times daily        10/01/21 1245             Note:  This document was prepared using Dragon voice recognition software and may include unintentional dictation errors.   Concha Se, MD 10/01/21 1246

## 2022-10-22 IMAGING — CT CT NECK W/ CM
4 of 5 series · 14 of 35 positions shown, 16 images · IV contrast (agent unspecified)
Comparison: None.

CLINICAL DATA: Parotid region mass.  Worsening facial swelling.

EXAM:
CT NECK WITH CONTRAST
TECHNIQUE: Multidetector CT imaging of the neck was performed using the
standard protocol following the bolus administration of intravenous
contrast.

[Series 4: axial bone · axial · 0.50mm/px · z∈[-266,-158]mm · 3 of 110 slices shown]
[im 28/110  bone]
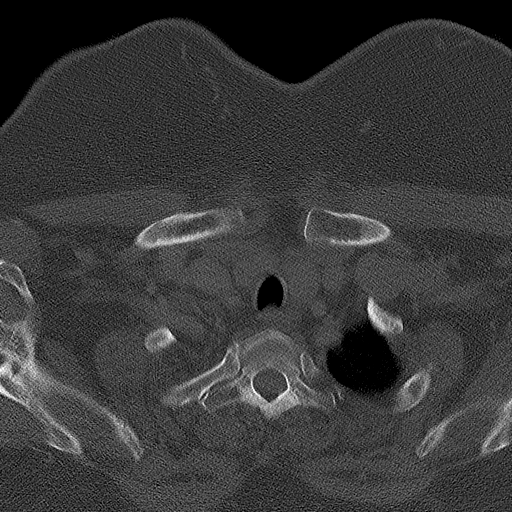
[im 55/110  bone]
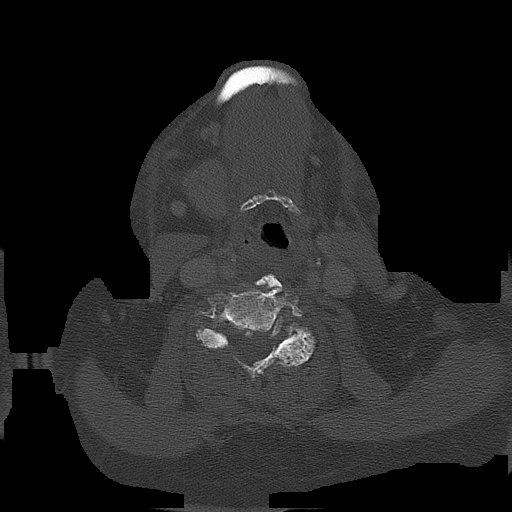
[im 82/110  bone]
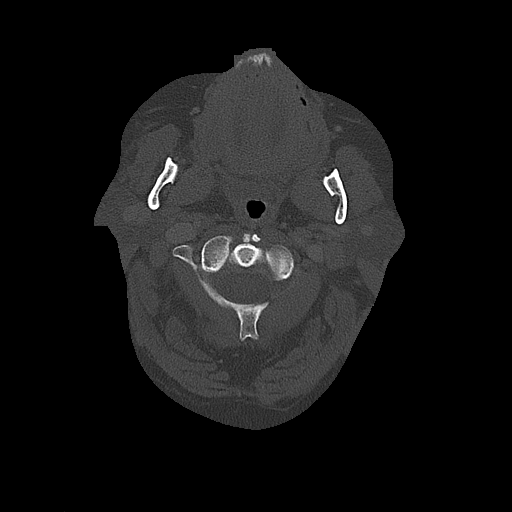

[Series 5: sag neck · sagittal · 0.49mm/px · 5 of 103 slices shown, 6 images]
[im 35/103  bone]
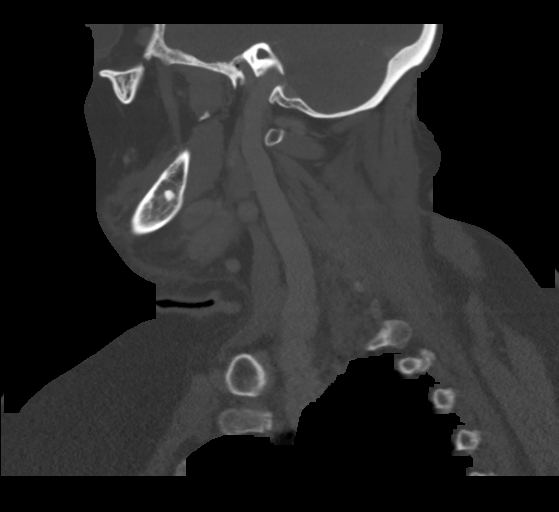
[im 43/103  bone]
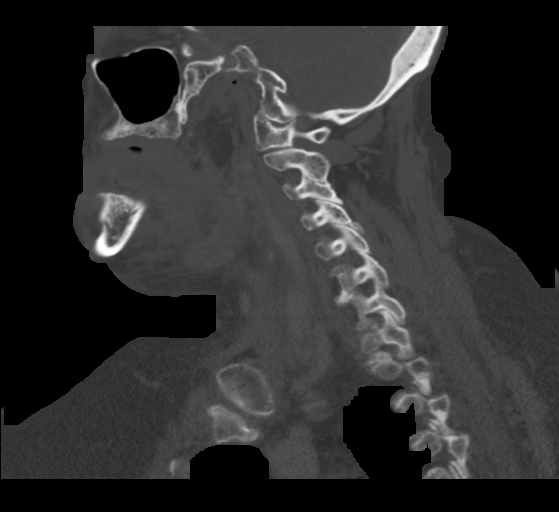
[im 52/103  soft-tissue]
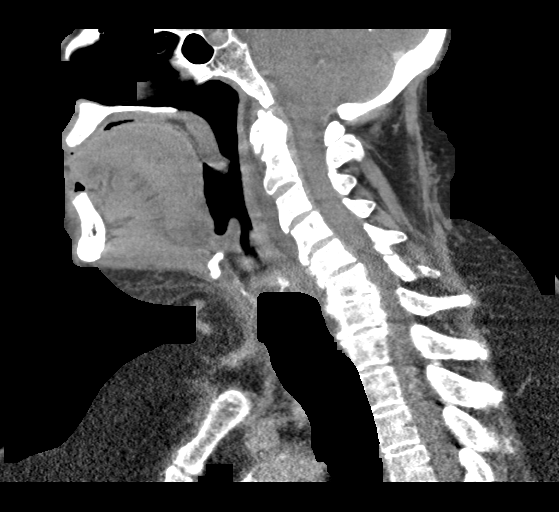
[im 52/103  bone]
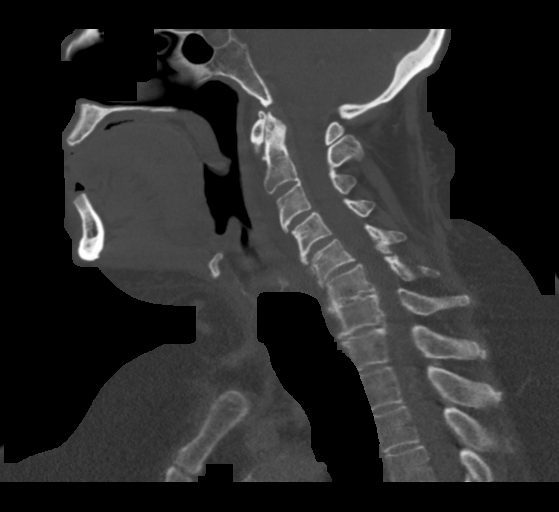
[im 60/103  bone]
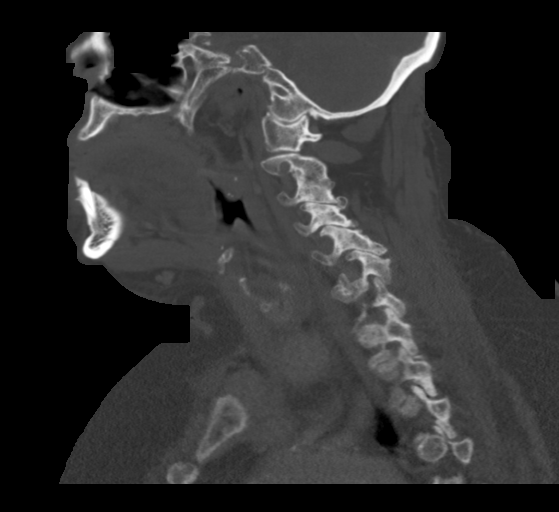
[im 69/103  bone]
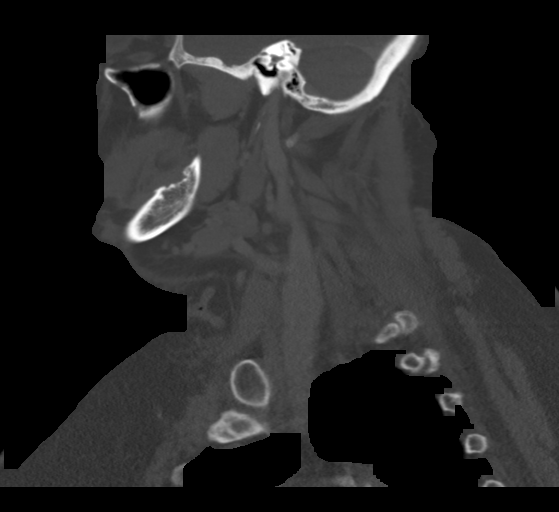

[Series 6: cor neck · coronal · 0.45mm/px · 3 of 153 slices shown]
[im 31/153  bone]
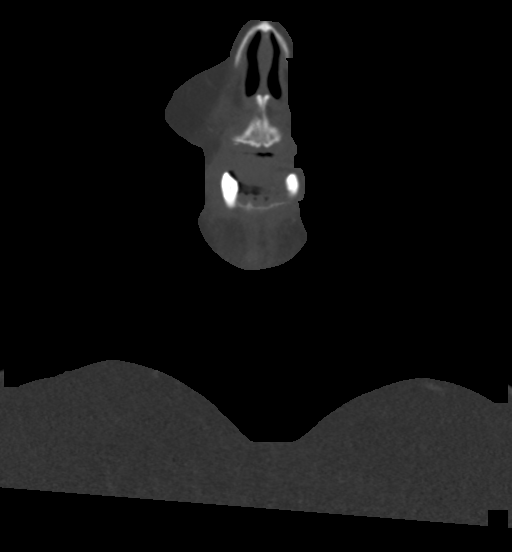
[im 61/153  bone]
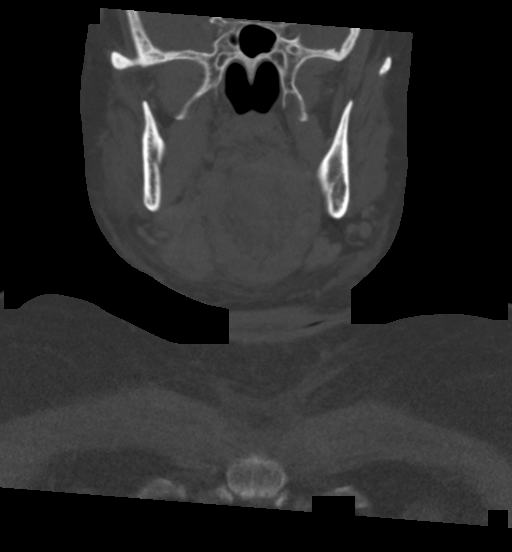
[im 92/153  bone]
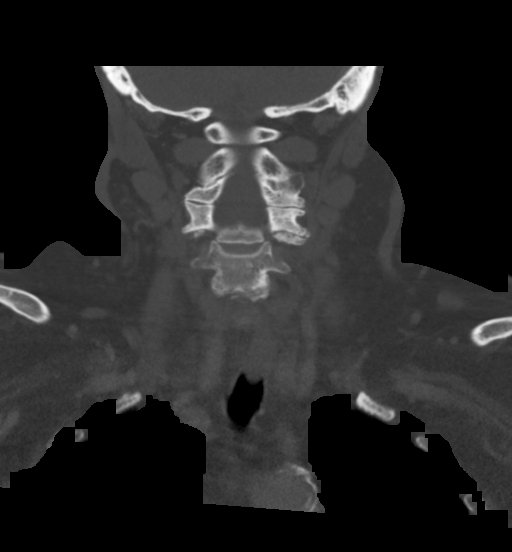

[Series 7: orthogonal (person_name) · axial · 0.44mm/px · z∈[-280,-160]mm · 3 of 120 slices shown, 4 images]
[im 30/120  soft-tissue]
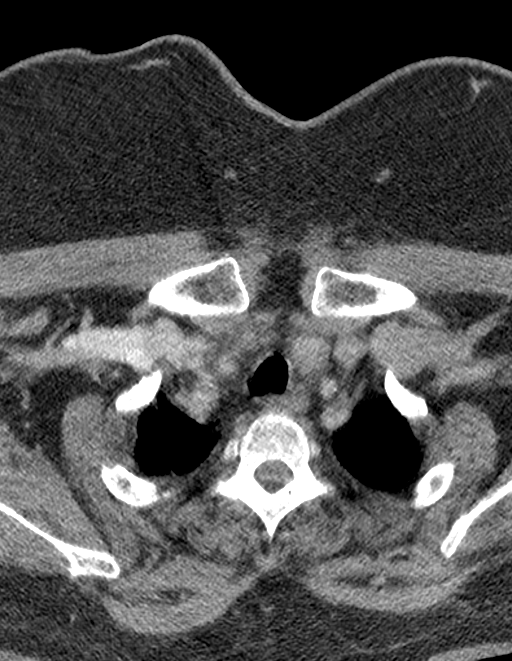
[im 30/120  bone]
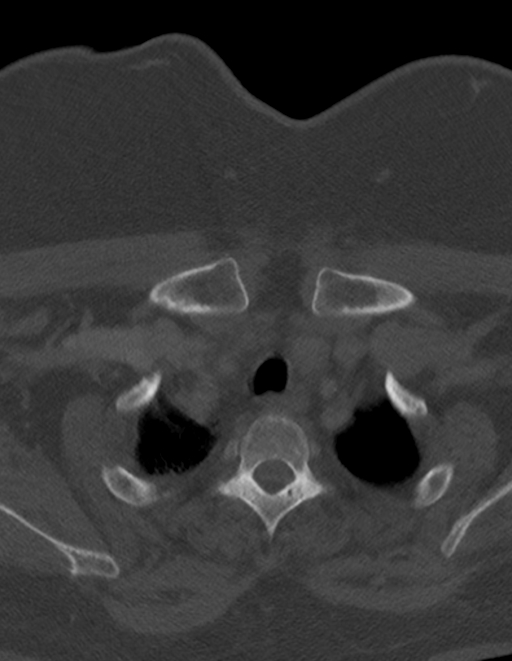
[im 60/120  bone]
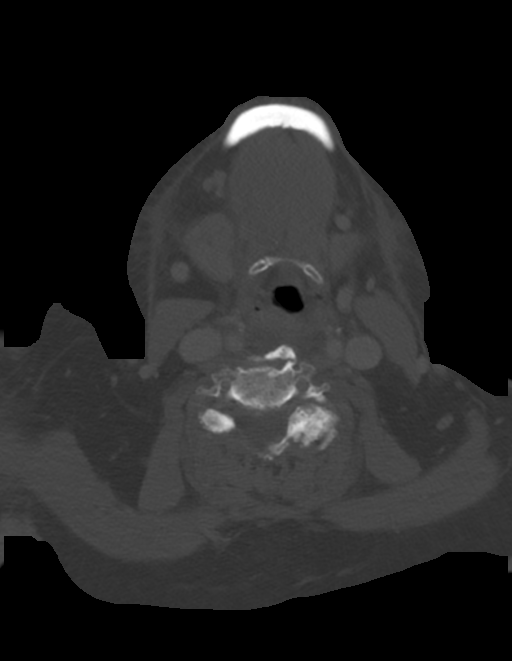
[im 90/120  bone]
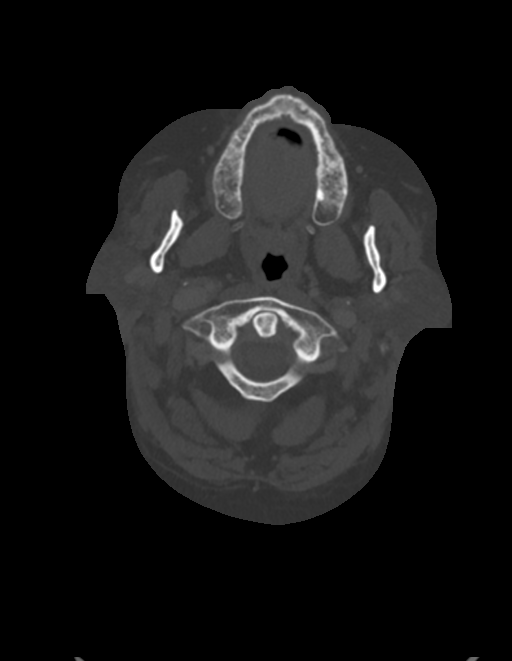

[14 of 35 positions shown; findings below may reference images not displayed]

RADIATION DOSE REDUCTION: This exam was performed according to the
departmental dose-optimization program which includes automated
exposure control, adjustment of the mA and/or kV according to
patient size and/or use of iterative reconstruction technique.

CONTRAST:  65mL OMNIPAQUE IOHEXOL 350 MG/ML SOLN
FINDINGS: Pharynx and larynx: Mild asymmetric soft tissue thickening/edema
involving the right lateral oropharynx with mild epiglottic
thickening as well. Mild retropharyngeal edema or small effusion.
Widely patent airway.

Salivary glands: Asymmetrically enlarged/edematous right
submandibular gland with surrounding inflammation which extends into
the subcutaneous soft tissues of the right lower face as well as
into the parotid tail region. Additional soft tissue
swelling/inflammation in the submental region and in the left lower
face including in the left submandibular space with normal
appearance of the left submandibular gland itself. No parotid or
submandibular mass or salivary stone.

Thyroid: Unremarkable.

Lymph nodes: Mildly prominent but subcentimeter short axis lymph
nodes in levels Ib and II, right slightly larger than left and
likely reactive.

Vascular: Major vascular structures of the neck are grossly patent.
Retropharyngeal course of the proximal right ICA.

Limited intracranial: Unremarkable.

Visualized orbits: Unremarkable.

Mastoids and visualized paranasal sinuses: Clear.

Skeleton: No acute osseous abnormality or suspicious osseous lesion.
Asymmetrically advanced left-sided facet arthrosis at multiple
levels in the cervical spine. Bilateral facet ankylosis at C2-3.
Interbody ankylosis at C6-7.

Upper chest: No apical lung consolidation or mass.

Other: None.
IMPRESSION: Right submandibular gland sialadenitis with trans-spatial
inflammation as above. No salivary stone or mass.

## 2022-12-05 ENCOUNTER — Emergency Department
Admission: EM | Admit: 2022-12-05 | Discharge: 2022-12-05 | Disposition: A | Discharge status: Home / Self Care | Payer: 59 | Attending: Emergency Medicine | Admitting: Emergency Medicine

## 2022-12-05 ENCOUNTER — Encounter: Payer: Self-pay | Admitting: Emergency Medicine

## 2022-12-05 ENCOUNTER — Other Ambulatory Visit: Payer: Self-pay

## 2022-12-05 DIAGNOSIS — B379 Candidiasis, unspecified: Secondary | ICD-10-CM

## 2022-12-05 DIAGNOSIS — N390 Urinary tract infection, site not specified: Secondary | ICD-10-CM | POA: Diagnosis not present

## 2022-12-05 DIAGNOSIS — N760 Acute vaginitis: Secondary | ICD-10-CM | POA: Insufficient documentation

## 2022-12-05 DIAGNOSIS — E119 Type 2 diabetes mellitus without complications: Secondary | ICD-10-CM | POA: Insufficient documentation

## 2022-12-05 DIAGNOSIS — L292 Pruritus vulvae: Secondary | ICD-10-CM | POA: Diagnosis present

## 2022-12-05 DIAGNOSIS — I10 Essential (primary) hypertension: Secondary | ICD-10-CM | POA: Insufficient documentation

## 2022-12-05 DIAGNOSIS — B3731 Acute candidiasis of vulva and vagina: Secondary | ICD-10-CM | POA: Insufficient documentation

## 2022-12-05 HISTORY — DX: Type 2 diabetes mellitus without complications: E11.9

## 2022-12-05 LAB — URINALYSIS, ROUTINE W REFLEX MICROSCOPIC
Bilirubin Urine: NEGATIVE
Glucose, UA: >=500 mg/dL — AB
Ketones, ur: 5 mg/dL — AB
Nitrite: POSITIVE — AB
Protein, ur: >=300 mg/dL — AB
Specific Gravity, Urine: 1.024 (ref 1.005–1.030)
WBC, UA: >50 WBC/hpf (ref 0–5)
pH: 5 (ref 5.0–8.0)

## 2022-12-05 LAB — CHLAMYDIA/NGC RT PCR (ARMC ONLY)
Chlamydia Tr: NOT DETECTED
N gonorrhoeae: NOT DETECTED

## 2022-12-05 LAB — WET PREP, GENITAL
Sperm: NONE SEEN
Trich, Wet Prep: NONE SEEN
WBC, Wet Prep HPF POC: >=10 — AB (ref <10)
Yeast Wet Prep HPF POC: NONE SEEN

## 2022-12-05 LAB — CBG MONITORING, ED: Glucose-Capillary: 295 mg/dL — ABNORMAL HIGH (ref 70–99)

## 2022-12-05 MED ORDER — ACETAMINOPHEN 325 MG PO TABS
650.0000 mg | ORAL_TABLET | Freq: Once | ORAL | Status: AC
  Administered 2022-12-05: 650 mg via ORAL
  Filled 2022-12-05: qty 2

## 2022-12-05 MED ORDER — CIPROFLOXACIN HCL 500 MG PO TABS: 500.0000 mg | ORAL_TABLET | Freq: Two times a day (BID) | ORAL | 0 refills | Status: AC

## 2022-12-05 MED ORDER — MICONAZOLE NITRATE 2 % EX CREA: TOPICAL_CREAM | CUTANEOUS | 1 refills | Status: AC

## 2022-12-05 MED ORDER — METRONIDAZOLE 500 MG PO TABS: 500.0000 mg | ORAL_TABLET | Freq: Two times a day (BID) | ORAL | 0 refills | Status: AC

## 2022-12-05 MED ORDER — FLUCONAZOLE 150 MG PO TABS: ORAL_TABLET | ORAL | 0 refills | Status: AC

## 2022-12-05 NOTE — ED Triage Notes (Signed)
Patient to ED for vaginal itching and mal odor urine. Patient states ongoing x5 days.

## 2022-12-05 NOTE — ED Provider Notes (Signed)
[facility] Provider Note    Event Date/Time   First MD Initiated Contact with Patient 12/05/22 1013     (approximate)   History   Vaginal Itching   HPI  Lindsay Lopez is a 67 y.o. female with history of diabetes, hypertension and MI presents emergency department vaginal itching and pain along with odor.  Patient states symptoms for 5 days.  States she has not been taking care of herself very well since 2023/06/22 when her son died.  Is still taking her medications as prescribed.  Also has burning with urination.  States has not had sexual intercourse for 3 years.      Physical Exam   Triage Vital Signs: ED Triage Vitals  Enc Vitals Group     BP 12/05/22 0958 (!) 149/80     Pulse Rate 12/05/22 0958 79     Resp 12/05/22 0958 18     Temp 12/05/22 0958 97.8 F (36.6 C)     Temp Source 12/05/22 0958 Oral     SpO2 12/05/22 0958 95 %     Weight --      Height --      Head Circumference --      Peak Flow --      Pain Score 12/05/22 0959 8     Pain Loc --      Pain Edu? --      Excl. in GC? --     Most recent vital signs: Vitals:   12/05/22 0958  BP: (!) 149/80  Pulse: 79  Resp: 18  Temp: 97.8 F (36.6 C)  SpO2: 95%     General: Awake, no distress.   CV:  Good peripheral perfusion. regular rate and  rhythm Resp:  Normal effort.  Abd:  No distention.   Other:  External vaginal exam with a large amount of discharge, labia are inflamed and tender, vaginal exam with speculum is painful for the patient, cervix appears to be very irritated   ED Results / Procedures / Treatments   Labs (all labs ordered are listed, but only abnormal results are displayed) Labs Reviewed  WET PREP, GENITAL - Abnormal; Notable for the following components:      Result Value   Clue Cells Wet Prep HPF POC PRESENT (*)    WBC, Wet Prep HPF POC >=10 (*)    All other components within normal limits  URINALYSIS, ROUTINE W REFLEX MICROSCOPIC - Abnormal; Notable  for the following components:   Color, Urine AMBER (*)    APPearance CLOUDY (*)    Glucose, UA >=500 (*)    Hgb urine dipstick SMALL (*)    Ketones, ur 5 (*)    Protein, ur >=300 (*)    Nitrite POSITIVE (*)    Leukocytes,Ua LARGE (*)    Bacteria, UA MANY (*)    All other components within normal limits  CBG MONITORING, ED - Abnormal; Notable for the following components:   Glucose-Capillary 295 (*)    All other components within normal limits  CHLAMYDIA/NGC RT PCR ([facility] ONLY)               EKG     RADIOLOGY     PROCEDURES:   Procedures   MEDICATIONS ORDERED IN ED: Medications  acetaminophen (TYLENOL) tablet 650 mg (650 mg Oral Given 12/05/22 1146)     IMPRESSION / MDM / ASSESSMENT AND PLAN / ED COURSE  I reviewed the triage vital signs and the nursing notes.  Differential diagnosis includes, but is not limited to, candidiasis, UTI, bacterial vaginosis, STD  Patient's presentation is most consistent with acute complicated illness / injury requiring diagnostic workup.   Wet prep shows clue cells indicating bacterial vaginosis, urinalysis has positive nitrites, large amount of leuks and many bacteria along with white blood cell clumps budding yeast and hyaline cast.  Patient CBG was 295.  Patient states her glucose normally runs in the 200s.  Feel that it is probably high due to the vaginal infection.  Therefore with the appearance of the urinalysis we will go ahead and treat the patient for candidiasis combined with bacterial vaginosis.  Patient was given Tylenol here for pain.  She was given a prescription for Diflucan, Flagyl, miconazole, and a 3-day course of Cipro.  The patient is in agreement with treatment plan.  She was discharged in stable condition.  Strict instructions to return if worsening.  See your regular doctor if not improving in 2 to 3 days.      FINAL CLINICAL IMPRESSION(S) / ED DIAGNOSES   Final diagnoses:   Acute UTI  Candidiasis  Bacterial vaginitis     Rx / DC Orders   ED Discharge Orders          Ordered    metroNIDAZOLE (FLAGYL) 500 MG tablet  2 times daily        12/05/22 1149    ciprofloxacin (CIPRO) 500 MG tablet  2 times daily        12/05/22 1149    fluconazole (DIFLUCAN) 150 MG tablet        12/05/22 1149    miconazole (MICATIN) 2 % cream        12/05/22 1149             Note:  This document was prepared using Dragon voice recognition software and may include unintentional dictation errors.    Faythe Ghee, PA-C 12/05/22 1219    Jene Every, MD 12/05/22 [phone]

## 2022-12-05 NOTE — ED Notes (Signed)
CBG 295 

## 2024-08-08 ENCOUNTER — Other Ambulatory Visit: Payer: Self-pay

## 2024-08-08 ENCOUNTER — Emergency Department: Admission: EM | Admit: 2024-08-08 | Discharge: 2024-08-08 | Disposition: A | Discharge status: Home / Self Care

## 2024-08-08 DIAGNOSIS — E119 Type 2 diabetes mellitus without complications: Secondary | ICD-10-CM | POA: Diagnosis not present

## 2024-08-08 DIAGNOSIS — I10 Essential (primary) hypertension: Secondary | ICD-10-CM | POA: Diagnosis not present

## 2024-08-08 DIAGNOSIS — R059 Cough, unspecified: Secondary | ICD-10-CM | POA: Diagnosis present

## 2024-08-08 DIAGNOSIS — J069 Acute upper respiratory infection, unspecified: Secondary | ICD-10-CM | POA: Insufficient documentation

## 2024-08-08 DIAGNOSIS — J4521 Mild intermittent asthma with (acute) exacerbation: Secondary | ICD-10-CM | POA: Diagnosis not present

## 2024-08-08 LAB — RESP PANEL BY RT-PCR (RSV, FLU A&B, COVID)  RVPGX2
Influenza A by PCR: NEGATIVE
Influenza B by PCR: NEGATIVE
Resp Syncytial Virus by PCR: NEGATIVE
SARS Coronavirus 2 by RT PCR: NEGATIVE

## 2024-08-08 LAB — GROUP A STREP BY PCR: Group A Strep by PCR: NOT DETECTED

## 2024-08-08 MED ORDER — IPRATROPIUM-ALBUTEROL 0.5-2.5 (3) MG/3ML IN SOLN
3.0000 mL | Freq: Once | RESPIRATORY_TRACT | Status: AC
  Administered 2024-08-08: 3 mL via RESPIRATORY_TRACT
  Filled 2024-08-08: qty 3

## 2024-08-08 MED ORDER — PSEUDOEPH-BROMPHEN-DM 30-2-10 MG/5ML PO SYRP: 5.0000 mL | ORAL_SOLUTION | Freq: Four times a day (QID) | ORAL | 0 refills | Status: AC | PRN

## 2024-08-08 MED ORDER — PREDNISONE 20 MG PO TABS
60.0000 mg | ORAL_TABLET | Freq: Once | ORAL | Status: AC
  Administered 2024-08-08: 60 mg via ORAL
  Filled 2024-08-08: qty 3

## 2024-08-08 MED ORDER — ALBUTEROL SULFATE HFA 108 (90 BASE) MCG/ACT IN AERS: 2.0000 | INHALATION_SPRAY | Freq: Four times a day (QID) | RESPIRATORY_TRACT | 2 refills | Status: AC | PRN

## 2024-08-08 MED ORDER — PREDNISONE 50 MG PO TABS: 50.0000 mg | ORAL_TABLET | Freq: Every day | ORAL | 0 refills | Status: AC

## 2024-08-08 NOTE — ED Provider Notes (Signed)
 "  [facility] Provider Note    Event Date/Time   First MD Initiated Contact with Patient 08/08/24 1908     (approximate)   History   Cough    HPI  Lindsay Lopez is a 68 y.o. female    with a past medical history of hypertension, diabetes type 2, shortness of breath, seizures, asthma,who presents to the ED complaining of shortness of . According to the patient, intensity started 3 days ago with cough, wheezing, chills.  Patient denies fever, chest pain, abdominal pain, urinary symptoms.     Patient Active Problem List   Diagnosis Date Noted   Encephalopathy acute 12/30/2020   ARF (acute renal failure) 09/05/2017     Physical Exam   Triage Vital Signs: ED Triage Vitals  Encounter Vitals Group     BP 08/08/24 1704 129/80     Girls Systolic BP Percentile --      Girls Diastolic BP Percentile --      Boys Systolic BP Percentile --      Boys Diastolic BP Percentile --      Pulse Rate 08/08/24 1704 70     Resp 08/08/24 1704 18     Temp 08/08/24 1704 97.8 F (36.6 C)     Temp Source 08/08/24 1704 Oral     SpO2 08/08/24 1704 93 %     Weight 08/08/24 1705 240 lb (108.9 kg)     Height 08/08/24 1705 5' 8 (1.727 m)     Head Circumference --      Peak Flow --      Pain Score 08/08/24 1705 0     Pain Loc --      Pain Education --      Exclude from Growth Chart --     Most recent vital signs: Vitals:   08/08/24 1704  BP: 129/80  Pulse: 70  Resp: 18  Temp: 97.8 F (36.6 C)  SpO2: 93%     Physical Exam Vitals and nursing note reviewed.  During triage vital signs were normal.  General:          Awake, no distress.  No use of accessory muscles.  Active cough during physical exam. Ears: Bilateral otoscopy within normal limits Throat: No peritonsillar erythema, no tonsillar enlargement. CV:                  Good peripheral perfusion. Regular rate and rhythm. Resp:               Normal effort. no tachypnea.Equal breath sounds bilaterally.   Bilateral expiratory wheezing Abd:                 No distention.  Soft nontender Other:               ED Results / Procedures / Treatments   Labs (all labs ordered are listed, but only abnormal results are displayed) Labs Reviewed  RESP PANEL BY RT-PCR (RSV, FLU A&B, COVID)  [id number]  GROUP A STREP BY PCR    PROCEDURES:  Critical Care performed:   Procedures   MEDICATIONS ORDERED IN ED: Medications  ipratropium-albuterol  (DUONEB) 0.5-2.5 (3) MG/3ML nebulizer solution 3 mL (3 mLs Nebulization Given 08/08/24 2014)  predniSONE  (DELTASONE ) tablet 60 mg (60 mg Oral Given 08/08/24 2013)   Clinical Course as of 08/08/24 2048  Thu Aug 08, 2024  2045 Reassess the patient after getting DuoNeb treatment and prednisone .  Resolution of expiratory wheezing.  Patient is  ready for discharge.  Patient is agreeable with the plan. [AE]    Clinical Course User Index [AE] Janit Kast, PA-C    IMPRESSION / MDM / ASSESSMENT AND PLAN / ED COURSE  I reviewed the triage vital signs and the nursing notes.  Differential diagnosis includes, but is not limited to, viral infection, asthma exacerbation, unlikely pneumonia.  Patient's presentation is most consistent with acute complicated illness / injury requiring diagnostic workup.   Lindsay Lopez is a 68 y.o., female presents today with history of 3 days of wheezing, cough, chills.  See HPI for further information.  On a physical exam vital signs were normal during triage.  No use of accessory muscles.  Not tachypneic.  Cardiopulmonary, there is presence of  bilateral expiratory wheezing.  Rest of physical exam is normal. Plan DuoNeb Prednisone .  Reassess Resolution of expiratory wheezing with DuoNeb and prednisone . Patient's diagnosis is consistent with asthma exacerbation, upper respiratory viral infection. I independently reviewed and interpreted imaging and agree with radiologists findings. Labs are  reassuring. I did review the patient's  allergies and medications.The patient is in stable and satisfactory condition for discharge home  Patient will be discharged home with prescriptions for his son, albuterol , cough syrup.. Patient is to follow up with PCP in 2 days for a follow-up as needed or otherwise directed. Patient is given ED precautions to return to the ED for any worsening or new symptoms. Discussed plan of care with patient, answered all of patient's questions, patient agreeable to plan of care. Advised patient to take medications according to the instructions on the label. Discussed possible side effects of new medications. Patient verbalized understanding.  FINAL CLINICAL IMPRESSION(S) / ED DIAGNOSES   Final diagnoses:  Viral URI with cough  Mild intermittent asthma with exacerbation     Rx / DC Orders   ED Discharge Orders          Ordered    predniSONE  (DELTASONE ) 50 MG tablet  Daily with breakfast        08/08/24 2047    brompheniramine-pseudoephedrine-DM 30-2-10 MG/5ML syrup  4 times daily PRN        08/08/24 2047    albuterol  (VENTOLIN  HFA) 108 (90 Base) MCG/ACT inhaler  Every 6 hours PRN        08/08/24 2047             Note:  This document was prepared using Dragon voice recognition software and may include unintentional dictation errors.   Janit Kast, PA-C 08/08/24 2049  "

## 2024-08-08 NOTE — Discharge Instructions (Signed)
 You have been diagnosed with viral upper respiratory infection with cough, asthma exacerbation.  Please drink plenty of fluids.  Please use albuterol  inhaler 2 puffs into the lungs every 6 hours as needed for wheezing or shortness of breath.  Please take cough syrup 5 mL by mouth every 6 hours as needed for cough.  Please take prednisone  1 tablet by mouth with breakfast for the next 3 days.  Please take your diabetes pill every day.  Please come back to ED or go to your PCP you have any symptoms symptoms worsen.  Please make an appointment with your PCP for a follow-up in 3 days.

## 2024-08-08 NOTE — ED Triage Notes (Signed)
 Pt here with a productive cough for 3 days. Pt denies SOB or CP. Pt states she is coughing up white and clear phlegm. Pt states she used her albuterol  inhaler but now she is out. Pt denies fevers at home.
